# Patient Record
Sex: Male | Born: 1970
Health system: Southern US, Community
[De-identification: ages and names within clinical notes are randomized; demographics above are authoritative.]

## PROBLEM LIST (undated history)

## (undated) DIAGNOSIS — I10 Essential (primary) hypertension: Secondary | ICD-10-CM

## (undated) HISTORY — PX: INNER EAR SURGERY: SHX679

---

## 2019-07-19 DIAGNOSIS — M47819 Spondylosis without myelopathy or radiculopathy, site unspecified: Secondary | ICD-10-CM | POA: Diagnosis not present

## 2019-07-19 DIAGNOSIS — Z1159 Encounter for screening for other viral diseases: Secondary | ICD-10-CM | POA: Diagnosis not present

## 2019-07-19 DIAGNOSIS — Z79899 Other long term (current) drug therapy: Secondary | ICD-10-CM | POA: Diagnosis not present

## 2019-07-19 DIAGNOSIS — M4807 Spinal stenosis, lumbosacral region: Secondary | ICD-10-CM | POA: Diagnosis not present

## 2019-07-19 DIAGNOSIS — M5410 Radiculopathy, site unspecified: Secondary | ICD-10-CM | POA: Diagnosis not present

## 2019-07-19 MED FILL — GABAPENTIN 300 MG CAPSULE: 300 | 90 days supply | Qty: 360 | Fill #0

## 2019-07-19 MED FILL — HYDROCODON-APAP 10-325: 10-325 | 30 days supply | Qty: 180 | Fill #0

## 2019-07-23 MED FILL — PANTOPRAZOLE SOD DR 40 MG T: 40 | 90 days supply | Qty: 90 | Fill #0

## 2019-08-16 DIAGNOSIS — M47819 Spondylosis without myelopathy or radiculopathy, site unspecified: Secondary | ICD-10-CM | POA: Diagnosis not present

## 2019-08-16 DIAGNOSIS — M4807 Spinal stenosis, lumbosacral region: Secondary | ICD-10-CM | POA: Diagnosis not present

## 2019-08-16 DIAGNOSIS — Z79899 Other long term (current) drug therapy: Secondary | ICD-10-CM | POA: Diagnosis not present

## 2019-08-16 DIAGNOSIS — M5410 Radiculopathy, site unspecified: Secondary | ICD-10-CM | POA: Diagnosis not present

## 2019-08-17 MED FILL — HYDROCODON-APAP 10-325: 10-325 | 30 days supply | Qty: 180 | Fill #0

## 2019-08-24 MED FILL — CIPROFLOXACIN-DEXAMETHASONE: 0.3-0.1 | 7 days supply | Qty: 8 | Fill #0

## 2019-09-13 DIAGNOSIS — Z79899 Other long term (current) drug therapy: Secondary | ICD-10-CM | POA: Diagnosis not present

## 2019-09-13 DIAGNOSIS — M4807 Spinal stenosis, lumbosacral region: Secondary | ICD-10-CM | POA: Diagnosis not present

## 2019-09-13 DIAGNOSIS — M47819 Spondylosis without myelopathy or radiculopathy, site unspecified: Secondary | ICD-10-CM | POA: Diagnosis not present

## 2019-09-13 DIAGNOSIS — M5410 Radiculopathy, site unspecified: Secondary | ICD-10-CM | POA: Diagnosis not present

## 2019-09-14 DIAGNOSIS — R030 Elevated blood-pressure reading, without diagnosis of hypertension: Secondary | ICD-10-CM | POA: Diagnosis not present

## 2019-09-14 DIAGNOSIS — K219 Gastro-esophageal reflux disease without esophagitis: Secondary | ICD-10-CM | POA: Diagnosis not present

## 2019-09-14 DIAGNOSIS — E781 Pure hyperglyceridemia: Secondary | ICD-10-CM | POA: Diagnosis not present

## 2019-09-14 DIAGNOSIS — F5104 Psychophysiologic insomnia: Secondary | ICD-10-CM | POA: Diagnosis not present

## 2019-09-14 MED FILL — METOPROLOL SUCCINATE ER 25: 25 | 30 days supply | Qty: 30 | Fill #0

## 2019-09-14 MED FILL — ZOLPIDEM TART ER 12.5 MG TA: 12.5 | 30 days supply | Qty: 30 | Fill #0

## 2019-09-15 MED FILL — HYDROCODON-APAP 10-325: 10-325 | 30 days supply | Qty: 180 | Fill #0

## 2019-10-05 MED FILL — PANTOPRAZOLE SOD DR 40 MG T: 40 | 30 days supply | Qty: 60 | Fill #0

## 2019-10-13 DIAGNOSIS — I10 Essential (primary) hypertension: Secondary | ICD-10-CM | POA: Diagnosis not present

## 2019-10-13 DIAGNOSIS — M4807 Spinal stenosis, lumbosacral region: Secondary | ICD-10-CM | POA: Diagnosis not present

## 2019-10-13 DIAGNOSIS — M47819 Spondylosis without myelopathy or radiculopathy, site unspecified: Secondary | ICD-10-CM | POA: Diagnosis not present

## 2019-10-13 DIAGNOSIS — M5410 Radiculopathy, site unspecified: Secondary | ICD-10-CM | POA: Diagnosis not present

## 2019-10-13 MED FILL — HYDROCODON-APAP 10-325: 10-325 | 30 days supply | Qty: 180 | Fill #0

## 2019-10-26 DIAGNOSIS — K219 Gastro-esophageal reflux disease without esophagitis: Secondary | ICD-10-CM | POA: Diagnosis not present

## 2019-10-26 DIAGNOSIS — I10 Essential (primary) hypertension: Secondary | ICD-10-CM | POA: Diagnosis not present

## 2019-10-26 DIAGNOSIS — E781 Pure hyperglyceridemia: Secondary | ICD-10-CM | POA: Diagnosis not present

## 2019-10-26 DIAGNOSIS — F5104 Psychophysiologic insomnia: Secondary | ICD-10-CM | POA: Diagnosis not present

## 2019-10-26 MED FILL — ZOLPIDEM TART ER 12.5 MG TA: 12.5 | 90 days supply | Qty: 90 | Fill #0

## 2019-10-26 MED FILL — METOPROLOL SUCCINATE ER 25: 25 | 90 days supply | Qty: 90 | Fill #0

## 2019-10-27 MED FILL — EPINEPHRINE 0.3 MG AUTO-INJ: 0.3 | 2 days supply | Qty: 2 | Fill #0

## 2019-11-13 DIAGNOSIS — Z79899 Other long term (current) drug therapy: Secondary | ICD-10-CM | POA: Diagnosis not present

## 2019-11-13 DIAGNOSIS — M47819 Spondylosis without myelopathy or radiculopathy, site unspecified: Secondary | ICD-10-CM | POA: Diagnosis not present

## 2019-11-13 DIAGNOSIS — M4807 Spinal stenosis, lumbosacral region: Secondary | ICD-10-CM | POA: Diagnosis not present

## 2019-11-13 DIAGNOSIS — M5410 Radiculopathy, site unspecified: Secondary | ICD-10-CM | POA: Diagnosis not present

## 2019-11-27 DIAGNOSIS — H60312 Diffuse otitis externa, left ear: Secondary | ICD-10-CM | POA: Diagnosis not present

## 2019-11-27 DIAGNOSIS — H6982 Other specified disorders of Eustachian tube, left ear: Secondary | ICD-10-CM | POA: Diagnosis not present

## 2019-11-27 MED FILL — CIPROFLOXACIN-DEXAMETHASONE: 0.3-0.1 | 19 days supply | Qty: 8 | Fill #0

## 2019-11-27 MED FILL — FLUTICASONE PROP 50 MCG SPR: 50 | 30 days supply | Qty: 16 | Fill #0

## 2019-11-27 MED FILL — predniSONE 20 MG TABS: 20 | 5 days supply | Qty: 10 | Fill #0

## 2019-12-01 MED FILL — PANTOPRAZOLE SOD DR 40 MG T: 40 | 30 days supply | Qty: 60 | Fill #1

## 2019-12-13 DIAGNOSIS — M4807 Spinal stenosis, lumbosacral region: Secondary | ICD-10-CM | POA: Diagnosis not present

## 2019-12-13 DIAGNOSIS — Z79899 Other long term (current) drug therapy: Secondary | ICD-10-CM | POA: Diagnosis not present

## 2019-12-13 DIAGNOSIS — M5410 Radiculopathy, site unspecified: Secondary | ICD-10-CM | POA: Diagnosis not present

## 2019-12-13 DIAGNOSIS — M47819 Spondylosis without myelopathy or radiculopathy, site unspecified: Secondary | ICD-10-CM | POA: Diagnosis not present

## 2020-01-12 DIAGNOSIS — M47819 Spondylosis without myelopathy or radiculopathy, site unspecified: Secondary | ICD-10-CM | POA: Diagnosis not present

## 2020-01-12 DIAGNOSIS — Z79899 Other long term (current) drug therapy: Secondary | ICD-10-CM | POA: Diagnosis not present

## 2020-01-12 DIAGNOSIS — M5410 Radiculopathy, site unspecified: Secondary | ICD-10-CM | POA: Diagnosis not present

## 2020-01-12 DIAGNOSIS — M4807 Spinal stenosis, lumbosacral region: Secondary | ICD-10-CM | POA: Diagnosis not present

## 2020-02-11 DIAGNOSIS — M4807 Spinal stenosis, lumbosacral region: Secondary | ICD-10-CM | POA: Diagnosis not present

## 2020-02-11 DIAGNOSIS — M47819 Spondylosis without myelopathy or radiculopathy, site unspecified: Secondary | ICD-10-CM | POA: Diagnosis not present

## 2020-02-11 DIAGNOSIS — S058X2A Other injuries of left eye and orbit, initial encounter: Secondary | ICD-10-CM | POA: Diagnosis not present

## 2020-02-11 DIAGNOSIS — M5410 Radiculopathy, site unspecified: Secondary | ICD-10-CM | POA: Diagnosis not present

## 2020-02-11 DIAGNOSIS — Z79899 Other long term (current) drug therapy: Secondary | ICD-10-CM | POA: Diagnosis not present

## 2020-02-21 MED FILL — METOPROLOL SUCCINATE ER 25: 25 | 90 days supply | Qty: 90 | Fill #1

## 2020-03-06 ENCOUNTER — Other Ambulatory Visit (HOSPITAL_COMMUNITY): Payer: Self-pay | Admitting: Family Medicine

## 2020-03-06 DIAGNOSIS — K219 Gastro-esophageal reflux disease without esophagitis: Secondary | ICD-10-CM | POA: Diagnosis not present

## 2020-03-06 DIAGNOSIS — Z8639 Personal history of other endocrine, nutritional and metabolic disease: Secondary | ICD-10-CM | POA: Diagnosis not present

## 2020-03-06 DIAGNOSIS — H60501 Unspecified acute noninfective otitis externa, right ear: Secondary | ICD-10-CM | POA: Diagnosis not present

## 2020-03-06 DIAGNOSIS — E6609 Other obesity due to excess calories: Secondary | ICD-10-CM | POA: Diagnosis not present

## 2020-03-06 DIAGNOSIS — Z6831 Body mass index (BMI) 31.0-31.9, adult: Secondary | ICD-10-CM | POA: Diagnosis not present

## 2020-03-06 DIAGNOSIS — F5104 Psychophysiologic insomnia: Secondary | ICD-10-CM | POA: Diagnosis not present

## 2020-03-06 DIAGNOSIS — E781 Pure hyperglyceridemia: Secondary | ICD-10-CM | POA: Diagnosis not present

## 2020-03-06 MED FILL — ESZOPICLONE 2 MG TAB: 2 | 30 days supply | Qty: 30 | Fill #0

## 2020-03-06 MED FILL — NEO/POLYMYXIN/HC EAR SOLN: 3.5-10000-1 | 17 days supply | Qty: 10 | Fill #0

## 2020-03-13 DIAGNOSIS — M47819 Spondylosis without myelopathy or radiculopathy, site unspecified: Secondary | ICD-10-CM | POA: Diagnosis not present

## 2020-03-13 DIAGNOSIS — Z79899 Other long term (current) drug therapy: Secondary | ICD-10-CM | POA: Diagnosis not present

## 2020-03-13 DIAGNOSIS — M4807 Spinal stenosis, lumbosacral region: Secondary | ICD-10-CM | POA: Diagnosis not present

## 2020-03-13 DIAGNOSIS — M5410 Radiculopathy, site unspecified: Secondary | ICD-10-CM | POA: Diagnosis not present

## 2020-04-03 DIAGNOSIS — F5104 Psychophysiologic insomnia: Secondary | ICD-10-CM | POA: Diagnosis not present

## 2020-04-03 DIAGNOSIS — E6609 Other obesity due to excess calories: Secondary | ICD-10-CM | POA: Diagnosis not present

## 2020-04-03 DIAGNOSIS — K219 Gastro-esophageal reflux disease without esophagitis: Secondary | ICD-10-CM | POA: Diagnosis not present

## 2020-04-03 DIAGNOSIS — I10 Essential (primary) hypertension: Secondary | ICD-10-CM | POA: Diagnosis not present

## 2020-04-03 DIAGNOSIS — Z6831 Body mass index (BMI) 31.0-31.9, adult: Secondary | ICD-10-CM | POA: Diagnosis not present

## 2020-04-06 MED FILL — EPINEPHRINE 0.3 MG AUTO-INJ: 0.3 | 2 days supply | Qty: 2 | Fill #1

## 2020-04-11 MED FILL — FLUTICASONE PROP 50 MCG SPR: 50 | 30 days supply | Qty: 16 | Fill #1

## 2020-04-11 MED FILL — PANTOPRAZOLE SOD DR 40 MG T: 40 | 90 days supply | Qty: 180 | Fill #1

## 2020-04-11 MED FILL — ESZOPICLONE 3 MG TABS: 3 | 30 days supply | Qty: 30 | Fill #0

## 2020-04-13 DIAGNOSIS — M4807 Spinal stenosis, lumbosacral region: Secondary | ICD-10-CM | POA: Diagnosis not present

## 2020-04-13 DIAGNOSIS — Z79899 Other long term (current) drug therapy: Secondary | ICD-10-CM | POA: Diagnosis not present

## 2020-04-13 DIAGNOSIS — M47819 Spondylosis without myelopathy or radiculopathy, site unspecified: Secondary | ICD-10-CM | POA: Diagnosis not present

## 2020-04-13 DIAGNOSIS — M5410 Radiculopathy, site unspecified: Secondary | ICD-10-CM | POA: Diagnosis not present

## 2020-04-21 DIAGNOSIS — E6609 Other obesity due to excess calories: Secondary | ICD-10-CM | POA: Diagnosis not present

## 2020-04-21 DIAGNOSIS — Z6831 Body mass index (BMI) 31.0-31.9, adult: Secondary | ICD-10-CM | POA: Diagnosis not present

## 2020-04-27 DIAGNOSIS — U071 COVID-19: Secondary | ICD-10-CM | POA: Diagnosis not present

## 2020-05-03 MED FILL — ONDANSETRON HCL 4 MG TABLET: 4 | 30 days supply | Qty: 30 | Fill #0

## 2020-05-10 DIAGNOSIS — Z79899 Other long term (current) drug therapy: Secondary | ICD-10-CM | POA: Diagnosis not present

## 2020-05-10 DIAGNOSIS — M4807 Spinal stenosis, lumbosacral region: Secondary | ICD-10-CM | POA: Diagnosis not present

## 2020-05-10 DIAGNOSIS — M47819 Spondylosis without myelopathy or radiculopathy, site unspecified: Secondary | ICD-10-CM | POA: Diagnosis not present

## 2020-05-10 DIAGNOSIS — M5410 Radiculopathy, site unspecified: Secondary | ICD-10-CM | POA: Diagnosis not present

## 2020-05-22 MED FILL — ALBUTEROL SULFATE HFA 108 (: 108 (90 BAS | 50 days supply | Qty: 18 | Fill #0

## 2020-05-22 MED FILL — METOPROLOL SUCCINATE ER 25: 25 | 90 days supply | Qty: 90 | Fill #0

## 2020-05-30 DIAGNOSIS — Z6831 Body mass index (BMI) 31.0-31.9, adult: Secondary | ICD-10-CM | POA: Diagnosis not present

## 2020-05-30 DIAGNOSIS — E6609 Other obesity due to excess calories: Secondary | ICD-10-CM | POA: Diagnosis not present

## 2020-05-31 ENCOUNTER — Other Ambulatory Visit (HOSPITAL_COMMUNITY): Payer: Self-pay | Admitting: Family Medicine

## 2020-05-31 MED FILL — ONDANSETRON HCL 4 MG TABLET: 4 | 30 days supply | Qty: 30 | Fill #0

## 2020-06-13 DIAGNOSIS — Z79899 Other long term (current) drug therapy: Secondary | ICD-10-CM | POA: Diagnosis not present

## 2020-06-13 DIAGNOSIS — B9689 Other specified bacterial agents as the cause of diseases classified elsewhere: Secondary | ICD-10-CM | POA: Diagnosis not present

## 2020-06-13 DIAGNOSIS — E669 Obesity, unspecified: Secondary | ICD-10-CM | POA: Diagnosis not present

## 2020-06-13 DIAGNOSIS — L089 Local infection of the skin and subcutaneous tissue, unspecified: Secondary | ICD-10-CM | POA: Diagnosis not present

## 2020-06-13 DIAGNOSIS — M5410 Radiculopathy, site unspecified: Secondary | ICD-10-CM | POA: Diagnosis not present

## 2020-06-13 DIAGNOSIS — M4807 Spinal stenosis, lumbosacral region: Secondary | ICD-10-CM | POA: Diagnosis not present

## 2020-06-13 DIAGNOSIS — M47819 Spondylosis without myelopathy or radiculopathy, site unspecified: Secondary | ICD-10-CM | POA: Diagnosis not present

## 2020-07-11 DIAGNOSIS — M25252 Flail joint, left hip: Secondary | ICD-10-CM | POA: Diagnosis not present

## 2020-07-11 DIAGNOSIS — M5410 Radiculopathy, site unspecified: Secondary | ICD-10-CM | POA: Diagnosis not present

## 2020-07-11 DIAGNOSIS — Z79899 Other long term (current) drug therapy: Secondary | ICD-10-CM | POA: Diagnosis not present

## 2020-07-11 DIAGNOSIS — M4807 Spinal stenosis, lumbosacral region: Secondary | ICD-10-CM | POA: Diagnosis not present

## 2020-07-11 DIAGNOSIS — M47819 Spondylosis without myelopathy or radiculopathy, site unspecified: Secondary | ICD-10-CM | POA: Diagnosis not present

## 2020-07-17 MED FILL — PANTOPRAZOLE SOD DR 40 MG T: 40 | 90 days supply | Qty: 180 | Fill #0

## 2020-07-17 MED FILL — ALBUTEROL SULFATE HFA 108 (: 108 (90 BAS | 50 days supply | Qty: 18 | Fill #1

## 2020-07-31 ENCOUNTER — Other Ambulatory Visit (HOSPITAL_COMMUNITY): Payer: Self-pay | Admitting: Physician Assistant

## 2020-07-31 MED FILL — PREGABALIN 50 MG CAPS: 50 | 90 days supply | Qty: 270 | Fill #0

## 2020-08-02 MED FILL — METOPROLOL SUCCINATE ER 25: 25 | 90 days supply | Qty: 90 | Fill #1

## 2020-08-11 DIAGNOSIS — M47819 Spondylosis without myelopathy or radiculopathy, site unspecified: Secondary | ICD-10-CM | POA: Diagnosis not present

## 2020-08-11 DIAGNOSIS — M5410 Radiculopathy, site unspecified: Secondary | ICD-10-CM | POA: Diagnosis not present

## 2020-08-11 DIAGNOSIS — Z79899 Other long term (current) drug therapy: Secondary | ICD-10-CM | POA: Diagnosis not present

## 2020-08-11 DIAGNOSIS — M4807 Spinal stenosis, lumbosacral region: Secondary | ICD-10-CM | POA: Diagnosis not present

## 2020-08-11 DIAGNOSIS — M25252 Flail joint, left hip: Secondary | ICD-10-CM | POA: Diagnosis not present

## 2020-08-14 MED FILL — ONDANSETRON HCL 4 MG TABLET: 4 | 30 days supply | Qty: 30 | Fill #1

## 2020-08-22 DIAGNOSIS — E6609 Other obesity due to excess calories: Secondary | ICD-10-CM | POA: Diagnosis not present

## 2020-08-22 DIAGNOSIS — Z6831 Body mass index (BMI) 31.0-31.9, adult: Secondary | ICD-10-CM | POA: Diagnosis not present

## 2020-09-11 DIAGNOSIS — Z79899 Other long term (current) drug therapy: Secondary | ICD-10-CM | POA: Diagnosis not present

## 2020-09-11 DIAGNOSIS — M5410 Radiculopathy, site unspecified: Secondary | ICD-10-CM | POA: Diagnosis not present

## 2020-09-11 DIAGNOSIS — Z1159 Encounter for screening for other viral diseases: Secondary | ICD-10-CM | POA: Diagnosis not present

## 2020-09-11 DIAGNOSIS — M4807 Spinal stenosis, lumbosacral region: Secondary | ICD-10-CM | POA: Diagnosis not present

## 2020-09-11 DIAGNOSIS — M47819 Spondylosis without myelopathy or radiculopathy, site unspecified: Secondary | ICD-10-CM | POA: Diagnosis not present

## 2020-09-13 DIAGNOSIS — K047 Periapical abscess without sinus: Secondary | ICD-10-CM | POA: Diagnosis not present

## 2020-10-07 ENCOUNTER — Ambulatory Visit (INDEPENDENT_AMBULATORY_CARE_PROVIDER_SITE_OTHER): Payer: Worker's Compensation

## 2020-10-07 ENCOUNTER — Ambulatory Visit (HOSPITAL_COMMUNITY): Payer: Self-pay

## 2020-10-07 ENCOUNTER — Encounter (HOSPITAL_COMMUNITY): Payer: Self-pay | Admitting: Emergency Medicine

## 2020-10-07 ENCOUNTER — Other Ambulatory Visit: Payer: Self-pay

## 2020-10-07 ENCOUNTER — Ambulatory Visit (HOSPITAL_COMMUNITY)
Admission: EM | Admit: 2020-10-07 | Discharge: 2020-10-07 | Disposition: A | Discharge status: Home / Self Care | Payer: Worker's Compensation | Attending: Emergency Medicine | Admitting: Emergency Medicine

## 2020-10-07 DIAGNOSIS — S43402A Unspecified sprain of left shoulder joint, initial encounter: Secondary | ICD-10-CM

## 2020-10-07 DIAGNOSIS — W19XXXA Unspecified fall, initial encounter: Secondary | ICD-10-CM

## 2020-10-07 DIAGNOSIS — M25512 Pain in left shoulder: Secondary | ICD-10-CM | POA: Diagnosis not present

## 2020-10-07 HISTORY — DX: Essential (primary) hypertension: I10

## 2020-10-07 MED ORDER — IBUPROFEN 600 MG PO TABS: 600.0000 mg | ORAL_TABLET | Freq: Four times a day (QID) | ORAL | 0 refills | Status: AC | PRN

## 2020-10-07 MED ORDER — ACETAMINOPHEN 325 MG PO TABS
975.0000 mg | ORAL_TABLET | Freq: Once | ORAL | Status: AC
  Administered 2020-10-07: 975 mg via ORAL

## 2020-10-07 MED ORDER — TIZANIDINE HCL 4 MG PO TABS: 4.0000 mg | ORAL_TABLET | Freq: Three times a day (TID) | ORAL | 0 refills | Status: AC | PRN

## 2020-10-07 MED ORDER — IBUPROFEN 800 MG PO TABS
ORAL_TABLET | ORAL | Status: AC
  Filled 2020-10-07: qty 1

## 2020-10-07 MED ORDER — IBUPROFEN 800 MG PO TABS
800.0000 mg | ORAL_TABLET | Freq: Once | ORAL | Status: AC
  Administered 2020-10-07: 800 mg via ORAL

## 2020-10-07 MED ORDER — ACETAMINOPHEN 325 MG PO TABS
ORAL_TABLET | ORAL | Status: AC
  Filled 2020-10-07: qty 3

## 2020-10-07 NOTE — ED Provider Notes (Signed)
HPI  SUBJECTIVE:  Alex Cooke is a right-hand 50 y.o. 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   does not remember if he hit his left shoulder against the bar or whether he grabbed the bar.  States that he landed with his feet on the ground.  He states the pain is located along the top and posterior aspect of his left shoulder.  He states it feels like he "pulled it".  He reports numbness and tingling in his fingers that has since resolved.  No bruising, swelling, grip weakness.  He has not tried anything for this.  No alleviating factors.  Symptoms are worse with movement.  Past medical history of hypertension.  No history of diabetes, osteoporosis, chronic kidney disease, history of left shoulder injury.  JEH:UDJSH, Wilhemina Bonito, MD  This occurred at work-will be a Worker's Compensation case.  Past Medical History:  Diagnosis Date  . Hypertension     Past Surgical History:  Procedure Laterality Date  . INNER EAR SURGERY      Family History  Problem Relation Age of Onset  . Diabetes Mother   . Heart failure Father     Social History   Tobacco Use  . Smoking status: Never Smoker  . Smokeless tobacco: Never Used  Substance Use Topics  . Alcohol use: Never  . Drug use: Never    No current facility-administered medications for this encounter.  Current Outpatient Medications:  .  albuterol (VENTOLIN HFA) 108 (90 Base) MCG/ACT inhaler, Inhale into the lungs., Disp: , Rfl:  .  EPINEPHrine 0.3 mg/0.3 mL IJ SOAJ injection, Inject into the muscle., Disp: , Rfl:  .  ibuprofen (ADVIL) 600 MG tablet, Take 1 tablet (600 mg total) by mouth every 6 (six) hours as needed., Disp: 30 tablet, Rfl: 0 .  metoprolol succinate (TOPROL-XL) 25 MG 24 hr tablet, Take by mouth., Disp: , Rfl:  .  tiZANidine (ZANAFLEX) 4 MG tablet, Take 1 tablet (4 mg total) by mouth every 8 (eight) hours as needed for muscle  spasms., Disp: 30 tablet, Rfl: 0 .  pantoprazole (PROTONIX) 20 MG tablet, Take by mouth., Disp: , Rfl:  .  WEGOVY 0.5 MG/0.5ML SOAJ, , Disp: , Rfl:   Allergies  Allergen Reactions  . Latex Rash and Shortness Of Breath  . Onion Anaphylaxis  . Other Shortness Of Breath  . Talwin [Pentazocine]     Other reaction(s): Other (See Comments) Hallucination Hallucination      ROS  As noted in HPI.   Physical Exam  BP 114/84   Pulse 75   Temp 97.7 F (36.5 C)   Resp 18   SpO2 100%   Constitutional: Well developed, well nourished, appears uncomfortable Eyes:  EOMI, conjunctiva normal bilaterally HENT: Normocephalic, atraumatic,mucus membranes moist Respiratory: Normal inspiratory effort Cardiovascular: Normal rate GI: nondistended skin: No rash, skin intact Musculoskeletal: Left shoulder with ROM somwehat limited, Drop test  painful but negative, clavicle NT, A/C joint NT , scapula tender, proximal humerus  tender, trapezius tender, Motor strength decreased at shoulder due to pain, Sensation intact LT over deltoid region, distal NVI with hand on affected side having intact sensation and strength in the distribution of the median, radial, and ulnar nerve. no pain with internal rotation, pain with external rotation, negative tenderness in bicipital groove, positive empty can test, patient unable to perform liftoff test.  Did not perform abduction/external rotation prior to imaging.  RP 2+.  No bruising over the scapula. Neurologic: Alert & oriented x 3, no focal neuro deficits Psychiatric: Speech and behavior appropriate   ED Course   Medications  acetaminophen (TYLENOL) tablet 975 mg (975 mg Oral Given 10/07/20 1618)  ibuprofen (ADVIL) tablet 800 mg (800 mg Oral Given 10/07/20 1618)    Orders Placed This Encounter  Procedures  . DG Shoulder Left    Standing Status:   Standing    Number of Occurrences:   1    Order Specific Question:   Reason for Exam (SYMPTOM  OR DIAGNOSIS  REQUIRED)    Answer:   L post shoulder pain r/o fx, dislocation    No results found for this or any previous visit (from the past 24 hour(s)). DG Shoulder Left  Result Date: 10/07/2020 CLINICAL DATA:  Acute LEFT shoulder pain following fall. Initial encounter. EXAM: LEFT SHOULDER - 2+ VIEW COMPARISON:  None. FINDINGS: There is no evidence of fracture or dislocation. There is no evidence of arthropathy or other focal bone abnormality. Soft tissues are unremarkable. IMPRESSION: Negative. Electronically Signed   By: Harmon Pier M.D.   On: 10/07/2020 16:42    ED Clinical Impression  1. Sprain of left shoulder, unspecified shoulder sprain type, initial encounter      ED Assessment/Plan  Patient appears uncomfortable.  Will give 975 mg Tylenol/800 mg ibuprofen.  Will obtain x-ray of the shoulder with a scapular view as he is primarily tender along the posterior shoulder and scapula.  Reviewed imaging independently.  Normal shoulder.  See radiology report for full details.  X-ray reassuring.  Patient with left shoulder strain/sprain.  Plan to send home with Tylenol/ibuprofen, Zanaflex, sling left arm.  Will write work note for him to be off tomorrow and he will follow-up with occupational health within the next week.  Works Sunday, is off until Friday.  Discussed  imaging, MDM, treatment plan, and plan for follow-up with patient patient agrees with plan.   Meds ordered this encounter  Medications  . acetaminophen (TYLENOL) tablet 975 mg  . ibuprofen (ADVIL) tablet 800 mg  . ibuprofen (ADVIL) 600 MG tablet    Sig: Take 1 tablet (600 mg total) by mouth every 6 (six) hours as needed.    Dispense:  30 tablet    Refill:  0  . tiZANidine (ZANAFLEX) 4 MG tablet    Sig: Take 1 tablet (4 mg total) by mouth every 8 (eight) hours as needed for muscle spasms.    Dispense:  30 tablet    Refill:  0    *This clinic note was created using Scientist, clinical (histocompatibility and immunogenetics). Therefore, there may be occasional  mistakes despite careful proofreading.   ?    Domenick Gong, MD 10/08/20 [phone]

## 2020-10-07 NOTE — ED Triage Notes (Signed)
Pt states that he was getting out of his car and he caught hisself from falling and grab on to the rail and may have hit his left shoulder. Pt states that he has pain radiating from his left shoulder to the back of his neck

## 2020-10-07 NOTE — Discharge Instructions (Addendum)
Your x-ray is normal.  Take 600 mg of ibuprofen combined with 1000 mg of Tylenol together 3-4 times a day as needed for pain.  Zanaflex for muscle spasms.  Ice the shoulder for 15 to 20 minutes at a time.  Wear the sling as needed for comfort.  Follow-up with occupational health in the next few days.

## 2020-10-10 DIAGNOSIS — M47819 Spondylosis without myelopathy or radiculopathy, site unspecified: Secondary | ICD-10-CM | POA: Diagnosis not present

## 2020-10-10 DIAGNOSIS — Z79899 Other long term (current) drug therapy: Secondary | ICD-10-CM | POA: Diagnosis not present

## 2020-10-10 DIAGNOSIS — M5410 Radiculopathy, site unspecified: Secondary | ICD-10-CM | POA: Diagnosis not present

## 2020-10-10 DIAGNOSIS — M75100 Unspecified rotator cuff tear or rupture of unspecified shoulder, not specified as traumatic: Secondary | ICD-10-CM | POA: Diagnosis not present

## 2020-10-10 DIAGNOSIS — M4807 Spinal stenosis, lumbosacral region: Secondary | ICD-10-CM | POA: Diagnosis not present

## 2020-10-13 ENCOUNTER — Other Ambulatory Visit (HOSPITAL_COMMUNITY): Payer: Self-pay | Admitting: Family Medicine

## 2020-10-13 MED FILL — PANTOPRAZOLE SOD DR 40 MG T: 40 | 90 days supply | Qty: 180 | Fill #1

## 2020-10-13 MED FILL — METOPROLOL SUCCINATE ER 25: 25 | 90 days supply | Qty: 90 | Fill #0

## 2020-10-17 DIAGNOSIS — Z6831 Body mass index (BMI) 31.0-31.9, adult: Secondary | ICD-10-CM | POA: Diagnosis not present

## 2020-10-17 DIAGNOSIS — E6609 Other obesity due to excess calories: Secondary | ICD-10-CM | POA: Diagnosis not present

## 2020-10-26 ENCOUNTER — Other Ambulatory Visit (HOSPITAL_COMMUNITY): Payer: Self-pay | Admitting: Orthopedic Surgery

## 2020-10-26 DIAGNOSIS — S4992XA Unspecified injury of left shoulder and upper arm, initial encounter: Secondary | ICD-10-CM

## 2020-11-03 ENCOUNTER — Other Ambulatory Visit: Payer: Self-pay

## 2020-11-03 ENCOUNTER — Ambulatory Visit (HOSPITAL_COMMUNITY)
Admission: RE | Admit: 2020-11-03 | Discharge: 2020-11-03 | Disposition: A | Discharge status: Home / Self Care | Payer: PRIVATE HEALTH INSURANCE | Source: Ambulatory Visit | Attending: Orthopedic Surgery | Admitting: Orthopedic Surgery

## 2020-11-03 DIAGNOSIS — S4992XA Unspecified injury of left shoulder and upper arm, initial encounter: Secondary | ICD-10-CM | POA: Insufficient documentation

## 2020-11-06 ENCOUNTER — Other Ambulatory Visit: Payer: Self-pay

## 2020-11-06 ENCOUNTER — Ambulatory Visit: Payer: PRIVATE HEALTH INSURANCE | Attending: Surgical | Admitting: Physical Therapy

## 2020-11-06 ENCOUNTER — Encounter: Payer: Self-pay | Admitting: Physical Therapy

## 2020-11-06 DIAGNOSIS — M25612 Stiffness of left shoulder, not elsewhere classified: Secondary | ICD-10-CM | POA: Diagnosis present

## 2020-11-06 DIAGNOSIS — M6281 Muscle weakness (generalized): Secondary | ICD-10-CM | POA: Diagnosis present

## 2020-11-06 DIAGNOSIS — G8929 Other chronic pain: Secondary | ICD-10-CM | POA: Diagnosis present

## 2020-11-06 DIAGNOSIS — R293 Abnormal posture: Secondary | ICD-10-CM | POA: Insufficient documentation

## 2020-11-06 DIAGNOSIS — M25512 Pain in left shoulder: Secondary | ICD-10-CM | POA: Diagnosis not present

## 2020-11-06 NOTE — Therapy (Signed)
[facility] [address] [place], [place], [address] Phone: [phone]   Fax:  [phone]  Physical Therapy Evaluation  Patient Details  Name: Alex Cooke MRN: [id number] Date of Birth: 1970-12-07 Referring Provider (PT): Jiles HaroldLaliberte, Danielle, [place]   Encounter Date: 11/06/2020   PT End of Session - 11/06/20 1105    Visit Number 1    Number of Visits 9    Date for PT Re-Evaluation 12/18/20    Authorization Type workers comp - [employer] [phone]  - capture FOTO on next visit.    PT Start Time 1100    PT Stop Time 1145    PT Time Calculation (min) 45 min    Activity Tolerance Patient limited by pain    Behavior [facility] for tasks assessed/performed           Past Medical History:  Diagnosis Date  . Hypertension     Past Surgical History:  Procedure Laterality Date  . INNER EAR SURGERY      There were no vitals filed for this visit.    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 He reports since onset he has continued pain. He reports pain is along the insdie of the shoudler blad into the joint, and reports N/T into the whole hand when that occurs when his hand is resting his arm.    How long can you sit comfortably? 20 min    How long can you stand comfortably? unlimited    How long can you walk comfortably? unlimited    Diagnostic tests MRI 4/1 1. 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  2. No full-thickness rotator cuff tear.  3. Mild glenohumeral and AC joint osteoarthritis.    Patient Stated Goals get rid of the pain, return to normal activity as much as possible.    Currently in Pain? Yes    Pain Score 9      Pain Location Shoulder    Pain Orientation Left;Medial    Pain Descriptors / Indicators Aching    Pain Type Chronic pain    Pain Onset More than a month ago    Pain Frequency Constant    Aggravating Factors  any activity / position    Pain Relieving Factors N/A    Effect of Pain on Daily Activities limiting shoulder AROM,              [facility] PT Assessment - 11/06/20 0001      Assessment   Medical Diagnosis L shoulder pain    Referring Provider (PT) Jiles HaroldLaliberte, Danielle, PA-C    Onset Date/Surgical Date --   10/07/2020   Hand Dominance Right    Next MD Visit 11/22/2020    Prior Therapy yes   for back     Precautions   Precautions None      Restrictions   Weight Bearing Restrictions No      Balance Screen   Has the patient fallen in the past 6 months Yes    How many times? 1    Has the patient had a decrease in activity level because of a fear of falling?  No  Is the patient reluctant to leave their home because of a fear of falling?  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[employer]   Vocation Requirements lifting, carrying, pushing/ pulling      Cognition   Overall Cognitive Status Within Functional Limits for tasks assessed      Posture/Postural Control   Posture/Postural Control Postural limitations    Postural Limitations Rounded Shoulders      ROM / Strength   AROM / PROM / Strength AROM;Strength;PROM      AROM   AROM Assessment Site Shoulder    Right/Left Shoulder Right;Left    Right Shoulder Extension 85 Degrees    Right Shoulder Flexion 150 Degrees     Right Shoulder ABduction 122 Degrees    Right Shoulder Internal Rotation --   T10   Right Shoulder External Rotation --   T2   Left Shoulder Extension 25 Degrees   pulling in along the upper trap   Left Shoulder Flexion 28 Degrees    Left Shoulder ABduction 36 Degrees    Left Shoulder Internal Rotation --   unable to assess   Left Shoulder External Rotation --   unable to asses     PROM   Overall PROM  Due to pain;Unable to assess    PROM Assessment Site Shoulder    Right/Left Shoulder Left      Strength   Overall Strength Unable to assess;Due to pain      Palpation   Palpation comment TTP along the L thoracic paraspinals, infrspaintus/ suprastus with spasm / triggers in the upper trap/ levator scapulae. pain and cavitation onted along long head of bicep tendon                      Objective measurements completed on examination: See above findings.       [facility] Adult PT Treatment/Exercise - 11/06/20 0001      Modalities   Modalities Electrical Stimulation;Moist Heat      Moist Heat Therapy   Number Minutes Moist Heat 10 Minutes    Moist Heat Location Shoulder   10     Electrical Stimulation   Electrical Stimulation Location L shoulder    Electrical Stimulation Action IFC    Electrical Stimulation Parameters L 13 x 10 min 100% scan    Electrical Stimulation Goals Pain                  PT Education - 11/06/20 1150    Education Details evaluaiont findings, POC, goals, HEP with proper form/ rationale.    Person(s) Educated Patient    Methods Explanation;Verbal cues;Handout    Comprehension Verbalized understanding;Verbal cues required            PT Short Term Goals - 11/06/20 1209      PT SHORT TERM GOAL #1   Title pt to be IND with inital HEP    Time 3    Period  Weeks    Status New    Target Date 11/27/20      PT SHORT TERM GOAL #2   Title caputre FOTO and updated LTGs    Time 1    Period Weeks    Status New    Target Date 11/13/20              PT Long Term Goals - 11/06/20 1210      PT LONG TERM GOAL #1   Title increase L shoulder AROM flexion/ abduction to >/= 80 degrees and IR/ER by >/= 20 degrees with max pain fo 6/10 to promote functional ROM    Time 6    Period Weeks    Status New    Target Date 12/18/20      PT LONG TERM GOAL #2   Title pt to reduce L shoulder max pain to </= 6/10 max pain for improvement in conditionn    Time 6    Period Weeks    Status New    Target Date 12/18/20      PT LONG TERM GOAL #3   Title pt to be IND with all HEP given and is able to maintain and progress current LOF    Time 6    Period Weeks    Status New    Target Date 12/18/20                  Plan - 11/06/20 1203    Clinical Impression Statement pt is a 50 y.o M presenting to OPPT with CC of L shoulder pain occuring after catching himself 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 limited assessment secondary to pain rated at 8/10. Gross L shoulder limtiations in both AROM/ PROM limited due to pain and guarding. TTP along the L upper trap/ levator scapulae with multiple trigger points, tenderness along the proximal biceps brachii, and peri-scapular musculature. he would benefit from physical therapy to decrease L shoulder pain, improve ROM and strength and return to PLOF by addressing the deficits listed.    Personal Factors and Comorbidities Comorbidity 1;Time since onset of injury/illness/exacerbation    Comorbidities hx of hypertension    Stability/Clinical Decision Making Evolving/Moderate complexity    Clinical Decision Making Moderate    Rehab Potential Good    PT Frequency 2x / week    PT Duration 4 weeks    PT Treatment/Interventions ADLs/Self Care Home Management;Electrical Stimulation;Iontophoresis 4mg /ml Dexamethasone;Cryotherapy;Moist Heat;Ultrasound;Gait training;Therapeutic activities;Therapeutic exercise;Balance training;Neuromuscular re-education;Manual techniques;Passive  range of motion;Dry needling;Taping;Vasopneumatic Device;Patient/family education    PT Next Visit Plan review / update HEP PRN, CAPTURE FOTO, shoulder PROM, AAROM, gentle scapular stability, STW, modalities for pain.    PT Home Exercise Plan [id number] - upper trap stretch, table slides flexion/ abduction, ER    Consulted and Agree with Plan of Care Patient           Patient will benefit from skilled therapeutic intervention in order to improve the following deficits and impairments:  Improper body mechanics,Decreased knowledge of precautions,Pain,Decreased range of motion,Decreased strength,Increased muscle spasms,Decreased endurance,Decreased activity tolerance  Visit Diagnosis: Chronic left shoulder pain  Muscle weakness (generalized)  Stiffness of left shoulder, not elsewhere classified  Abnormal posture     Problem List There are no problems to display for this patient.  PT, DPT, LAT, ATC  11/06/20  12:16 PM      [facility] [address] [place], [place], [place]  Phone: [phone]   Fax:  [phone]  Name: Alex Cooke MRN: [id number] Date of Birth: 08-19-1970

## 2020-11-09 ENCOUNTER — Ambulatory Visit: Payer: PRIVATE HEALTH INSURANCE | Attending: Surgical

## 2020-11-09 ENCOUNTER — Other Ambulatory Visit: Payer: Self-pay

## 2020-11-09 DIAGNOSIS — M6281 Muscle weakness (generalized): Secondary | ICD-10-CM

## 2020-11-09 DIAGNOSIS — G8929 Other chronic pain: Secondary | ICD-10-CM

## 2020-11-09 DIAGNOSIS — M25612 Stiffness of left shoulder, not elsewhere classified: Secondary | ICD-10-CM | POA: Diagnosis present

## 2020-11-09 DIAGNOSIS — R293 Abnormal posture: Secondary | ICD-10-CM | POA: Diagnosis present

## 2020-11-09 DIAGNOSIS — M25512 Pain in left shoulder: Secondary | ICD-10-CM | POA: Diagnosis not present

## 2020-11-09 NOTE — Therapy (Signed)
[facility] [address] [place], [place], [address] Phone: [phone]   Fax:  [phone]  Physical Therapy Treatment  Patient Details  Name: Alex Cooke MRN: [id number] Date of Birth: 09/24/1970 Referring Provider (PT): Jiles Harold, [place]   Encounter Date: 11/09/2020   PT End of Session - 11/09/20 1242    Visit Number 2    Number of Visits 9    Date for PT Re-Evaluation 12/18/20    Authorization Type workers comp - [employer] [phone]  -    Authorization - Visit Number 1    Authorization - Number of Visits 8    PT Start Time 1231    PT Stop Time 1321   ice pack at end of session 10 minutes   PT Time Calculation (min) 50 min    Activity Tolerance Patient limited by pain    Behavior During Therapy [facility] for tasks assessed/performed           Past Medical History:  Diagnosis Date  . Hypertension     Past Surgical History:  Procedure Laterality Date  . INNER EAR SURGERY      There were no vitals filed for this visit.   Subjective Assessment - 11/09/20 1233    Subjective Patient reports he hates his shoulder right now. He has been completing his HEP, but doesn't feel it is helping. He reports that he isn't very optimistic about getting back to his previous level of function and work duties.    How long can you sit comfortably? 20 min    How long can you stand comfortably? unlimited    How long can you walk comfortably? unlimited    Diagnostic tests MRI 4/1 1. 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  2. No full-thickness rotator cuff tear.  3. Mild glenohumeral and AC joint osteoarthritis.    Patient Stated Goals get rid of the pain, return to normal activity as much as possible.    Currently in Pain? Yes    Pain Score 8     Pain Location --   shoulder and scapula   Pain Orientation Left;Anterior;Posterior    Pain Descriptors / Indicators Aching;Burning    Pain Type --     Pain Onset More than a month ago              [facility] PT Assessment - 11/09/20 0001      Observation/Other Assessments   Focus on Therapeutic Outcomes (FOTO)  30% function 58% predicted                         [facility] Adult PT Treatment/Exercise - 11/09/20 0001      Self-Care   Self-Care Other Self-Care Comments    Other Self-Care Comments  see patient education      Neck Exercises: Theraband   Scapula Retraction Limitations attempted pain      Neck Exercises: Seated   Neck Retraction Limitations x 10; retraction with overpressure x 10; retraction with extension x 10    Shoulder Rolls 10 reps      Shoulder Exercises: Seated   Flexion 5 reps    Flexion Limitations towel slides    Other Seated Exercises scapular retraction attempted      Shoulder Exercises: ROM/Strengthening   Pendulum attempted pain      Cryotherapy   Number Minutes Cryotherapy 10 Minutes    Cryotherapy Location Shoulder    Type of Cryotherapy Ice  pack      Manual Therapy   Manual therapy comments STM posterior shoulder musculature Lt                  PT Education - 11/09/20 1326    Education Details FOTO score and anticipated progress. Posture education. Anatomy of current condition and rationale for exercises.    Person(s) Educated Patient    Methods Explanation;Demonstration;Verbal cues;Tactile cues    Comprehension Verbalized understanding;Verbal cues required;Need further instruction;Tactile cues required            PT Short Term Goals - 11/06/20 1209      PT SHORT TERM GOAL #1   Title pt to be IND with inital HEP    Time 3    Period Weeks    Status New    Target Date 11/27/20      PT SHORT TERM GOAL #2   Title caputre FOTO and updated LTGs    Time 1    Period Weeks    Status New    Target Date 11/13/20             PT Long Term Goals - 11/06/20 1210      PT LONG TERM GOAL #1   Title increase L shoulder AROM flexion/ abduction to >/= 80 degrees and  IR/ER by >/= 20 degrees with max pain fo 6/10 to promote functional ROM    Time 6    Period Weeks    Status New    Target Date 12/18/20      PT LONG TERM GOAL #2   Title pt to reduce L shoulder max pain to </= 6/10 max pain for improvement in conditionn    Time 6    Period Weeks    Status New    Target Date 12/18/20      PT LONG TERM GOAL #3   Title pt to be IND with all HEP given and is able to maintain and progress current LOF    Time 6    Period Weeks    Status New    Target Date 12/18/20                 Plan - 11/09/20 1242    Clinical Impression Statement Session was limited due to patient's pain and guarding of the Lt shoulder with attempts of various light shoulder mobility and postural correctives. Trialed repeated cervical retraction/extension movements as patient reports burning sensation along Lt medial scapula border, though no effect on pain with repeated movement. Attemped pendulums and scapular retraction with patient reporting increased pain with these exercises along the shoulder and scapular region, so these exercises we discontinued. Educated patient on importance of improving mobility to assist in overall pain reduction, though his tolerance to gentle movement at this time is severely limited due to pain and he remains guarded in the Lt shoulder throughout today's session.    Personal Factors and Comorbidities Comorbidity 1;Time since onset of injury/illness/exacerbation    Comorbidities hx of hypertension    Stability/Clinical Decision Making Evolving/Moderate complexity    Rehab Potential Good    PT Frequency 2x / week    PT Duration 4 weeks    PT Treatment/Interventions ADLs/Self Care Home Management;Electrical Stimulation;Iontophoresis 4mg /ml Dexamethasone;Cryotherapy;Moist Heat;Ultrasound;Gait training;Therapeutic activities;Therapeutic exercise;Balance training;Neuromuscular re-education;Manual techniques;Passive range of motion;Dry  needling;Taping;Vasopneumatic Device;Patient/family education    PT Next Visit Plan review / update HEP PRN,  shoulder PROM, AAROM, gentle scapular stability, STW, modalities for pain.    PT  Home Exercise Plan [id number] - upper trap stretch, table slides flexion/ abduction, ER    Consulted and Agree with Plan of Care Patient           Patient will benefit from skilled therapeutic intervention in order to improve the following deficits and impairments:  Improper body mechanics,Decreased knowledge of precautions,Pain,Decreased range of motion,Decreased strength,Increased muscle spasms,Decreased endurance,Decreased activity tolerance  Visit Diagnosis: Chronic left shoulder pain  Muscle weakness (generalized)  Stiffness of left shoulder, not elsewhere classified  Abnormal posture     Problem List There are no problems to display for this patient.  Letitia Libra, PT, DPT, ATC 11/09/20 1:34 PM  [facility] [address] [place], [place], [address] Phone: [phone]   Fax:  [phone]  Name: Alex Cooke MRN: [id number] Date of Birth: 1970/10/21

## 2020-11-10 DIAGNOSIS — M47819 Spondylosis without myelopathy or radiculopathy, site unspecified: Secondary | ICD-10-CM | POA: Diagnosis not present

## 2020-11-10 DIAGNOSIS — M25512 Pain in left shoulder: Secondary | ICD-10-CM | POA: Diagnosis not present

## 2020-11-10 DIAGNOSIS — M4807 Spinal stenosis, lumbosacral region: Secondary | ICD-10-CM | POA: Diagnosis not present

## 2020-11-10 DIAGNOSIS — M5410 Radiculopathy, site unspecified: Secondary | ICD-10-CM | POA: Diagnosis not present

## 2020-11-10 DIAGNOSIS — Z79899 Other long term (current) drug therapy: Secondary | ICD-10-CM | POA: Diagnosis not present

## 2020-11-14 ENCOUNTER — Ambulatory Visit: Payer: PRIVATE HEALTH INSURANCE | Attending: Surgical | Admitting: Physical Therapy

## 2020-11-14 ENCOUNTER — Encounter: Payer: Self-pay | Admitting: Physical Therapy

## 2020-11-14 ENCOUNTER — Other Ambulatory Visit: Payer: Self-pay

## 2020-11-14 DIAGNOSIS — G8929 Other chronic pain: Secondary | ICD-10-CM | POA: Insufficient documentation

## 2020-11-14 DIAGNOSIS — M25612 Stiffness of left shoulder, not elsewhere classified: Secondary | ICD-10-CM | POA: Insufficient documentation

## 2020-11-14 DIAGNOSIS — M6281 Muscle weakness (generalized): Secondary | ICD-10-CM | POA: Diagnosis present

## 2020-11-14 DIAGNOSIS — M25512 Pain in left shoulder: Secondary | ICD-10-CM | POA: Insufficient documentation

## 2020-11-14 DIAGNOSIS — R293 Abnormal posture: Secondary | ICD-10-CM | POA: Insufficient documentation

## 2020-11-14 NOTE — Therapy (Signed)
[place], [place], [address] Phone: [phone]   Fax:  [phone]  Physical Therapy Treatment  Patient Details  Name: JAYMARI CROMIE MRN: [id number] Date of Birth: 02-17-1971 Referring Provider (PT): Grier Mitts, [place]   Encounter Date: 11/14/2020   PT End of Session - 11/14/20 1418    Visit Number 3    Number of Visits 9    Date for PT Re-Evaluation 12/18/20    Authorization Type workers comp - [employer] [phone]  -    Authorization - Visit Number 2    Authorization - Number of Visits 8    PT Start Time 2957    PT Stop Time 1400    PT Time Calculation (min) 45 min           Past Medical History:  Diagnosis Date  . Hypertension     Past Surgical History:  Procedure Laterality Date  . INNER EAR SURGERY      There were no vitals filed for this visit.   Subjective Assessment - 11/14/20 1322    Subjective Shoulder hurts 7/10. Now my pain is more on my lateral upper arm and my bicep hurts, this is new. Back is burning on the left side which started when I began sitting for light duty.    Currently in Pain? Yes    Pain Score 7     Pain Location Shoulder    Pain Orientation Left;Anterior;Lateral    Pain Descriptors / Indicators Sharp;Burning;Aching    Pain Type Chronic pain    Aggravating Factors  any movements    Pain Relieving Factors avoiding painful movements              [facility] PT Assessment - 11/14/20 0001      AROM   Left Shoulder Flexion 115 Degrees    Left Shoulder ABduction 90 Degrees    Left Shoulder Internal Rotation --   reaches to left hip   Left Shoulder External Rotation --   reaches ear with assist from opp UE     PROM   Left Shoulder Flexion 120 Degrees                         [facility] Adult PT Treatment/Exercise - 11/14/20 0001      Neck Exercises: Seated   Postural Training standing scap retract x 10      Shoulder Exercises:  Seated   Flexion 5 reps    Flexion Limitations table slides seated at igh mat table.      Cryotherapy   Number Minutes Cryotherapy 5 Minutes    Cryotherapy Location Shoulder    Type of Cryotherapy Ice pack      Manual Therapy   Manual Therapy Passive ROM    Manual therapy comments STW lateral upper arm and bicep    Passive ROM Passive ROM shoulder flexion to 120                    PT Short Term Goals - 11/06/20 1209      PT SHORT TERM GOAL #1   Title pt to be IND with inital HEP    Time 3    Period Weeks    Status New    Target Date 11/27/20      PT SHORT TERM GOAL #2   Title caputre FOTO and updated LTGs    Time 1    Period Weeks  Status New    Target Date 11/13/20             PT Long Term Goals - 11/06/20 1210      PT LONG TERM GOAL #1   Title increase L shoulder AROM flexion/ abduction to >/= 80 degrees and IR/ER by >/= 20 degrees with max pain fo 6/10 to promote functional ROM    Time 6    Period Weeks    Status New    Target Date 12/18/20      PT LONG TERM GOAL #2   Title pt to reduce L shoulder max pain to </= 6/10 max pain for improvement in conditionn    Time 6    Period Weeks    Status New    Target Date 12/18/20      PT LONG TERM GOAL #3   Title pt to be IND with all HEP given and is able to maintain and progress current LOF    Time 6    Period Weeks    Status New    Target Date 12/18/20                 Plan - 11/14/20 1421    Clinical Impression Statement Pt reports 7/10 shoulder pain which is now more anterior shoulder/bicep and lateral upper arm. Upon palpation his upper arm has multiple spasm areas which were softened with STW. He reports new onset of left lower back pain which started while sitting for light duty. His standing AROM has improved to 115 flexion and 90 degrees abduction. He contines to be very limited with active reach into IR or ER. Reviewed his flexion table slides which he reported increased pain. He was  able to perform scap squeezes with increased pain. Time spent with manaul STW to decrease spasm in upper arm and stretch bicep. Ice applied at end of session to decrease pain.    PT Next Visit Plan Pt has high level of pain and muscle guarding; consider HOLD or contact WC ME:BRAXENMM; review / update HEP PRN,  shoulder PROM, AAROM, gentle scapular stability, STW, modalities for pain.    PT Home Exercise Plan [id number] - upper trap stretch, table slides flexion/ abduction, ER           Patient will benefit from skilled therapeutic intervention in order to improve the following deficits and impairments:  Improper body mechanics,Decreased knowledge of precautions,Pain,Decreased range of motion,Decreased strength,Increased muscle spasms,Decreased endurance,Decreased activity tolerance  Visit Diagnosis: Chronic left shoulder pain  Muscle weakness (generalized)  Stiffness of left shoulder, not elsewhere classified  Abnormal posture     Problem List There are no problems to display for this patient.   Dorene Ar, [place] 11/14/2020, 2:26 PM  [place], [place], [address] Phone: [phone]   Fax:  [phone]  Name: ESTHER BROYLES MRN: [id number] Date of Birth: 1971/04/15

## 2020-11-16 ENCOUNTER — Encounter: Payer: Self-pay | Admitting: Physical Therapy

## 2020-11-16 ENCOUNTER — Other Ambulatory Visit: Payer: Self-pay

## 2020-11-16 ENCOUNTER — Ambulatory Visit: Payer: PRIVATE HEALTH INSURANCE | Admitting: Physical Therapy

## 2020-11-16 DIAGNOSIS — M6281 Muscle weakness (generalized): Secondary | ICD-10-CM

## 2020-11-16 DIAGNOSIS — R293 Abnormal posture: Secondary | ICD-10-CM

## 2020-11-16 DIAGNOSIS — M25612 Stiffness of left shoulder, not elsewhere classified: Secondary | ICD-10-CM

## 2020-11-16 DIAGNOSIS — M25512 Pain in left shoulder: Secondary | ICD-10-CM | POA: Diagnosis not present

## 2020-11-16 DIAGNOSIS — G8929 Other chronic pain: Secondary | ICD-10-CM

## 2020-11-16 NOTE — Therapy (Signed)
[facility] [address] [place], [place], [address] Phone: [phone]   Fax:  [phone]  Physical Therapy Treatment  Patient Details  Name: Alex Cooke MRN: [id number] Date of Birth: [birth year] Referring Provider (PT): Jiles Harold, [place]   Encounter Date: 11/16/2020   PT End of Session - 11/16/20 1610    Visit Number 4    Number of Visits 9    Date for PT Re-Evaluation 12/18/20    Authorization Type workers comp - [employer] [id number]  -    Authorization - Visit Number 3    Authorization - Number of Visits 8    PT Start Time 1500    PT Stop Time 1550    PT Time Calculation (min) 50 min    Activity Tolerance Patient limited by pain    Behavior During Therapy [facility] for tasks assessed/performed           Past Medical History:  Diagnosis Date  . Hypertension     Past Surgical History:  Procedure Laterality Date  . INNER EAR SURGERY      There were no vitals filed for this visit.   Subjective Assessment - 11/16/20 1541    Subjective Pt. continues with high level of left shoulder pain today noting discomfort in left axillar region, bicep and proximal arm. He reports no significant improvement with therapy to date. Pain 7/10 this PM. Next MD follow up is 11/22/20.                             [facility] Adult PT Treatment/Exercise - 11/16/20 0001      Elbow Exercises   Elbow Flexion --   bicep curl/elbow flexion AROM x 15 reps     Shoulder Exercises: Supine   Other Supine Exercises supine wand ER AAROM x 15 reps, supine wand "chest press" AAROM assisted by therapist x 10 reps      Shoulder Exercises: Seated   Flexion AAROM;Left;10 reps    Flexion Limitations tables slides      Shoulder Exercises: Sidelying   External Rotation AROM;Left;15 reps    External Rotation Limitations partial ROM/limited due to c/o pain with "popping"      Shoulder Exercises: Standing   Other Standing  Exercises left shoulder pendulums x 2 minutes, attempted "saw" scapular retraction but held due to pain, switched to standing bilat. scapular retraction and performed x 15 reps      Cryotherapy   Number Minutes Cryotherapy 10 Minutes    Cryotherapy Location Shoulder    Type of Cryotherapy Ice pack      Manual Therapy   Manual Therapy Soft tissue mobilization;Joint mobilization    Joint Mobilization Grade I-II left GH AP mobilization    Soft tissue mobilization STM left bicep, deltoid region and pec major    Passive ROM PROM left shoulder all directions                  PT Education - 11/16/20 1609    Education Details POC, exercises    Person(s) Educated Patient    Methods Explanation;Verbal cues;Demonstration    Comprehension Verbalized understanding;Returned demonstration            PT Short Term Goals - 11/06/20 1209      PT SHORT TERM GOAL #1   Title pt to be IND with inital HEP    Time 3    Period Weeks    Status  New    Target Date 11/27/20      PT SHORT TERM GOAL #2   Title caputre FOTO and updated LTGs    Time 1    Period Weeks    Status New    Target Date 11/13/20             PT Long Term Goals - 11/06/20 1210      PT LONG TERM GOAL #1   Title increase L shoulder AROM flexion/ abduction to >/= 80 degrees and IR/ER by >/= 20 degrees with max pain fo 6/10 to promote functional ROM    Time 6    Period Weeks    Status New    Target Date 12/18/20      PT LONG TERM GOAL #2   Title pt to reduce L shoulder max pain to </= 6/10 max pain for improvement in conditionn    Time 6    Period Weeks    Status New    Target Date 12/18/20      PT LONG TERM GOAL #3   Title pt to be IND with all HEP given and is able to maintain and progress current LOF    Time 6    Period Weeks    Status New    Target Date 12/18/20                 Plan - 11/16/20 1653    Clinical Impression Statement No improvement with therapy to date with continued high  pain level and limited tolerance for exercises. Pt. has MD follow up next week prior to next scheduled PT session so will await status from MD visit if any changes in POC vs. continue to progress as tolerated on return. Minimal stiffness with PROM with ROM limitations more associated with weakness and pain.    Personal Factors and Comorbidities Comorbidity 1;Time since onset of injury/illness/exacerbation    Comorbidities hx of hypertension    Stability/Clinical Decision Making Evolving/Moderate complexity    Clinical Decision Making Moderate    Rehab Potential Good    PT Frequency 2x / week    PT Duration 4 weeks    PT Treatment/Interventions ADLs/Self Care Home Management;Electrical Stimulation;Iontophoresis 4mg /ml Dexamethasone;Cryotherapy;Moist Heat;Ultrasound;Gait training;Therapeutic activities;Therapeutic exercise;Balance training;Neuromuscular re-education;Manual techniques;Passive range of motion;Dry needling;Taping;Vasopneumatic Device;Patient/family education    PT Next Visit Plan Await status from MD follow up 4/20, if returning continue to progress shoulder AAROM, AROM as tolerated pending tolerance, manual work and modalities as needed for pain    PT Home Exercise Plan [id number] - upper trap stretch, table slides flexion/ abduction, ER    Consulted and Agree with Plan of Care Patient           Patient will benefit from skilled therapeutic intervention in order to improve the following deficits and impairments:  Improper body mechanics,Decreased knowledge of precautions,Pain,Decreased range of motion,Decreased strength,Increased muscle spasms,Decreased endurance,Decreased activity tolerance  Visit Diagnosis: Chronic left shoulder pain  Muscle weakness (generalized)  Stiffness of left shoulder, not elsewhere classified  Abnormal posture     Problem List There are no problems to display for this patient.   5/20, PT, DPT 11/16/20 4:58 PM  [facility] [address] [place], [place], [place] Phone: [phone]   Fax:  [phone]  Name: Alex Cooke MRN: Peterson Ao Date of Birth: January 11, 1971

## 2020-11-23 ENCOUNTER — Ambulatory Visit: Payer: PRIVATE HEALTH INSURANCE | Admitting: Rehabilitative and Restorative Service Providers"

## 2020-11-23 ENCOUNTER — Ambulatory Visit (HOSPITAL_COMMUNITY)
Admission: RE | Admit: 2020-11-23 | Discharge: 2020-11-23 | Disposition: A | Discharge status: Home / Self Care | Payer: PRIVATE HEALTH INSURANCE | Source: Ambulatory Visit | Attending: Orthopedic Surgery | Admitting: Orthopedic Surgery

## 2020-11-23 ENCOUNTER — Other Ambulatory Visit: Payer: Self-pay

## 2020-11-23 ENCOUNTER — Encounter: Payer: Self-pay | Admitting: Rehabilitative and Restorative Service Providers"

## 2020-11-23 ENCOUNTER — Other Ambulatory Visit (HOSPITAL_COMMUNITY): Payer: Self-pay | Admitting: Orthopedic Surgery

## 2020-11-23 DIAGNOSIS — M25512 Pain in left shoulder: Secondary | ICD-10-CM

## 2020-11-23 DIAGNOSIS — M6281 Muscle weakness (generalized): Secondary | ICD-10-CM

## 2020-11-23 DIAGNOSIS — M25612 Stiffness of left shoulder, not elsewhere classified: Secondary | ICD-10-CM

## 2020-11-23 DIAGNOSIS — M79602 Pain in left arm: Secondary | ICD-10-CM | POA: Insufficient documentation

## 2020-11-23 DIAGNOSIS — R293 Abnormal posture: Secondary | ICD-10-CM

## 2020-11-23 DIAGNOSIS — M79601 Pain in right arm: Secondary | ICD-10-CM | POA: Diagnosis present

## 2020-11-23 DIAGNOSIS — G8929 Other chronic pain: Secondary | ICD-10-CM

## 2020-11-23 NOTE — Therapy (Addendum)
[place], [place], [address] Phone: [phone]   Fax:  [phone]  Physical Therapy Treatment  Patient Details  Name: Alex Cooke MRN: [id number] Date of Birth: 10/03/1970 Referring Provider (PT): Grier Mitts, [place]   Encounter Date: 11/23/2020   PT End of Session - 11/23/20 1738    Visit Number 5    Number of Visits 9    Date for PT Re-Evaluation 12/18/20    Authorization Type workers comp - [employer] [phone]  -    Authorization - Visit Number 5    Authorization - Number of Visits 8    PT Start Time 0204    PT Stop Time 0250    PT Time Calculation (min) 46 min    Activity Tolerance Patient limited by pain    Behavior During Therapy Agitated;Restless;Anxious           Past Medical History:  Diagnosis Date  . Hypertension     Past Surgical History:  Procedure Laterality Date  . INNER EAR SURGERY      There were no vitals filed for this visit.   Subjective Assessment - 11/23/20 1406    Subjective "MD stated that my issue was a chronic problem and MD disagreed with radiologist. MD gave me a cortisone shot and told me to come back to PT. I am not getting better. I told the doctor that. I told him exactly what I thought. I didn't sleep at all last night I was hurting so badly."    Currently in Pain? Yes    Pain Score 7     Pain Location Shoulder    Pain Orientation Left;Anterior;Posterior;Proximal    Pain Type Chronic pain    Pain Onset More than a month ago    Pain Frequency Constant    Multiple Pain Sites No                                       PT Short Term Goals - 11/06/20 1209      PT SHORT TERM GOAL #1   Title pt to be IND with inital HEP    Time 3    Period Weeks    Status New    Target Date 11/27/20      PT SHORT TERM GOAL #2   Title caputre FOTO and updated LTGs    Time 1    Period Weeks    Status New    Target Date  11/13/20             PT Long Term Goals - 11/23/20 1749      PT LONG TERM GOAL #1   Title increase L shoulder AROM flexion/ abduction to >/= 80 degrees and IR/ER by >/= 20 degrees with max pain fo 6/10 to promote functional ROM    Status On-going      PT LONG TERM GOAL #2   Title pt to reduce L shoulder max pain to </= 6/10 max pain for improvement in conditionn    Status On-going      PT LONG TERM GOAL #3   Title pt to be IND with all HEP given and is able to maintain and progress current LOF    Status On-going                 Plan - 11/23/20 1741  Clinical Impression Statement Pt arrived to PT upset regarding MD appt 11/22/20. He received a cortisone injection 11/22/20. Pt was displeased with his care by Dr. Tamera Punt. He voiced increased resting night pain and inability to sleep all night due to L shoulder pain. Upon assessment, PT noted that L deltoid was warmer compared to the contralateral side along with L hand discoloration. L hand was dark blue and cold compared to contralateral side. Radial pulse present L. L finger capillary refill present throughout digits 1-5. PT phoned [facility] office and spoke with a clinician (name not remembered) who stated he would get message to [place] due to [place] being in surgery. PT phoned wc manager Almyra Free and [facility] case manager regarding pt presentation of L hand's blue discoloration throughout and his reports of increased L shoulder pain after injection. Circumferential measurement taken wrist to mid palm in figure 8 fashion L 49 cm and 48 cm R. Palmar/forearm circumferential measurements =. MD/case managers to phone patient regarding next steps given change in status. Pt voices he did not notice his hand was blue and cold but only that his L shoulder hurt more. PT only charging for assessment today. Pictures were taken by evaluating PT and sent to [facility] for assessment. Evaluating PT spoke with Andee Poles, [place], regarding pt presentation as  above.    Rehab Potential Good    PT Frequency 2x / week    PT Duration 4 weeks    PT Treatment/Interventions ADLs/Self Care Home Management;Electrical Stimulation;Iontophoresis 75m/ml Dexamethasone;Cryotherapy;Moist Heat;Ultrasound;Gait training;Therapeutic activities;Therapeutic exercise;Balance training;Neuromuscular re-education;Manual techniques;Passive range of motion;Dry needling;Taping;Vasopneumatic Device;Patient/family education    PT Next Visit Plan What did MD say? Per prior plan, continue to progress AAROM, AROM as tol, manual work and modalities PRN    Consulted and Agree with Plan of Care Patient           Patient will benefit from skilled therapeutic intervention in order to improve the following deficits and impairments:  Improper body mechanics,Decreased knowledge of precautions,Pain,Decreased range of motion,Decreased strength,Increased muscle spasms,Decreased endurance,Decreased activity tolerance  Visit Diagnosis: Chronic left shoulder pain  Muscle weakness (generalized)  Stiffness of left shoulder, not elsewhere classified  Abnormal posture     Problem List There are no problems to display for this patient.   NAmerica Cooke PT, DPT 11/23/2020, 5:58 PM  [facility] [place] [address]Phone: [phone]  Fax:  [phone] Name: Alex Cooke: [id number] of Birth: [birth year]  PHYSICAL THERAPY DISCHARGE SUMMARY  Visits from Start of Care: 5  Current functional level related to goals / functional outcomes: See notes; PT had notification to discharge patient due to [facility] to [employer]   Remaining deficits: pain   Education / Equipment: HEP; see notes Plan: Patient agrees to discharge.  Patient goals were not met. Patient is being discharged due to the physician's request.  ?????

## 2020-11-28 ENCOUNTER — Other Ambulatory Visit: Payer: Self-pay

## 2020-11-28 ENCOUNTER — Ambulatory Visit (INDEPENDENT_AMBULATORY_CARE_PROVIDER_SITE_OTHER): Payer: PRIVATE HEALTH INSURANCE | Admitting: Physical Medicine and Rehabilitation

## 2020-11-28 ENCOUNTER — Encounter: Payer: Self-pay | Admitting: Physical Medicine and Rehabilitation

## 2020-11-28 ENCOUNTER — Ambulatory Visit: Payer: PRIVATE HEALTH INSURANCE

## 2020-11-28 DIAGNOSIS — R202 Paresthesia of skin: Secondary | ICD-10-CM

## 2020-11-28 NOTE — Progress Notes (Signed)
Pt state left shoulder and arm pain. Pt state when moving arm outward or lifting object makes the pain worse. Pt state he doesn't take anything for the pain. Pt state he is right handed.  Numeric Pain Rating Scale and Functional Assessment Average Pain 5   In the last MONTH (on 0-10 scale) has pain interfered with the following?  1. General activity like being  able to carry out your everyday physical activities such as walking, climbing stairs, carrying groceries, or moving a chair?  Rating(8)

## 2020-11-29 NOTE — Progress Notes (Signed)
Alex Cooke - 50 y.o. male MRN [id number]  Date of birth: [birth year]  Office Visit Note: Visit Date: 11/28/2020 PCP: Clyda Hurdle, MD Referred by: Clyda Hurdle, MD  Subjective: Chief Complaint  Patient presents with  . Right Shoulder - Pain  . Right Arm - Pain   HPI:  Alex Cooke is a 50 y.o. male who comes in today At the request of Dr. Isabella Stalling at [place] for electrodiagnostic study of the left upper limb.  Patient is right-hand dominant.  Patient reports 5 out of 10 pain in the left shoulder and arm radiating into the dorsal lateral part of the forearm since accident about 6 to 7 weeks ago.  The date of the injury was October 07, 2020 where evidently he was stepping out of a truck and fell grabbing the door and rail with his left arm.  He has tried and failed conservative care with medication management, physical therapy and injection treatment.  He did have MRI of the shoulder which Dr. Tamera Punt reviewed is having mild arthritic changes of the glenohumeral joint and AC joint as well as some low-grade interstitial changes of the distal subscapularis tendon.  He has not had MRI of the cervical spine.  He has had no prior history of cervical radiculopathy.  No prior history of carpal tunnel syndrome.  He has not had any electrodiagnostic studies in the past.  He is not diabetic.  He does report that his physical therapist noticed swelling on the left arm and discoloration and swelling and temperature change.  He had not noticed until they mentioned it but he does notice now increased sweating as well as coolness and cooler temperature of the left hand in a nondermatomal fashion.  Initially he had some symptoms into the more radial digits but the symptoms into the fingers is actually improved.  ROS Otherwise per HPI.  Assessment & Plan: Visit Diagnoses:    ICD-10-CM   1. Paresthesia of skin  R20.2 NCV with EMG (electromyography)     Plan: Impression: The above electrodiagnostic study is ABNORMAL and reveals evidence of a mild left median nerve entrapment at the wrist affecting sensory components.  There is likely some temperature artifact.    . There is no significant electrodiagnostic evidence of any other focal nerve entrapment, brachial plexopathy or cervical radiculopathy in the left upper limb.  As you know, this particular electrodiagnostic study cannot rule out chemical radiculitis or sensory only radiculopathy.     Recommendations: 1.  Follow-up with referring physician. 2.  Continue current management of symptoms. 3.  Suggest MRI cervical spine to further rule out potential for radiculitis or sensory radiculopathy.  Consider diagnosis of complex regional pain syndrome.  [place] Criteria for CRPS  Complex Regional Pain Syndrome describes an array of painful conditions that are characterised by a continuing (spontaneous and/or evoked) regional pain that is seemingly disproportionate in time or degree to the usual course of any known trauma or other lesion. The pain is regional (not in a specific nerve territory or dermatome) and usually has a distal predominance of abnormal sensory, motor, sudomotor, vasomotor, and/or trophic findings. The syndrome shows variable progression over time.  To make the clinical diagnosis, the following criteria must be met:  . Continuing pain, which is disproportionate to any inciting event.  . Must report at least one symptom in all four of the following categories: o sensory - reports of hyperaesthesia and/or allodynia o  vasomotor - reports of temperature asymmetry and/or skin colour changes and/or skin colour asymmetry o sudomotor/oedema - reports of oedema and/or sweating changes and/or sweating asymmetry o motor/trophic - reports of decreased range of motion and/or motor dysfunction (weakness, tremor, dystonia) and/or trophic changes (hair, nail, skin).  . Must display at  least one sign at time of evaluation in two or more of the following categories: o sensory - evidence of hyperalgesia (to pinprick) and/or allodynia (to light touch and/or temperature sensation and/or deep somatic pressure and/or joint movement) o vasomotor - evidence of temperature asymmetry (>?1?C) and/or skin colour changes and/or asymmetry o sudomotor/oedema - evidence of oedema and/or sweating changes and/or sweating asymmetry o motor/trophic - evidence of decreased range of motion and/or motor dysfunction (weakness, tremor, dystonia) and/or trophic changes (hair, nail, skin)  There is no other diagnosis that better explains the signs and symptoms.  Meds & Orders: No orders of the defined types were placed in this encounter.   Orders Placed This Encounter  Procedures  . NCV with EMG (electromyography)    Follow-up: Return for Isabella Stalling, MD.   Procedures: No procedures performed  EMG & NCV Findings: Evaluation of the left median (across palm) sensory nerve showed prolonged distal peak latency (Wrist, 3.9 ms) and prolonged distal peak latency (Palm, 2.1 ms).  The left ulnar sensory nerve showed prolonged distal peak latency (4.1 ms) and decreased conduction velocity (Wrist-5th Digit, 34 m/s).  All remaining nerves (as indicated in the following tables) were within normal limits.    All examined muscles (as indicated in the following table) showed no evidence of electrical instability.    Impression: The above electrodiagnostic study is ABNORMAL and reveals evidence of a mild left median nerve entrapment at the wrist affecting sensory components.  There is likely some temperature artifact.    . There is no significant electrodiagnostic evidence of any other focal nerve entrapment, brachial plexopathy or cervical radiculopathy in the left upper limb.  As you know, this particular electrodiagnostic study cannot rule out chemical radiculitis or sensory only radiculopathy.      Recommendations: 1.  Follow-up with referring physician. 2.  Continue current management of symptoms. 3.  Suggest MRI cervical spine to further rule out potential for radiculitis or sensory radiculopathy.  Consider diagnosis of complex regional pain syndrome.  [place] Criteria for CRPS  Complex Regional Pain Syndrome describes an array of painful conditions that are characterised by a continuing (spontaneous and/or evoked) regional pain that is seemingly disproportionate in time or degree to the usual course of any known trauma or other lesion. The pain is regional (not in a specific nerve territory or dermatome) and usually has a distal predominance of abnormal sensory, motor, sudomotor, vasomotor, and/or trophic findings. The syndrome shows variable progression over time.  To make the clinical diagnosis, the following criteria must be met:  . Continuing pain, which is disproportionate to any inciting event.  . Must report at least one symptom in all four of the following categories: o sensory - reports of hyperaesthesia and/or allodynia o vasomotor - reports of temperature asymmetry and/or skin colour changes and/or skin colour asymmetry o sudomotor/oedema - reports of oedema and/or sweating changes and/or sweating asymmetry o motor/trophic - reports of decreased range of motion and/or motor dysfunction (weakness, tremor, dystonia) and/or trophic changes (hair, nail, skin).  . Must display at least one sign at time of evaluation in two or more of the following categories: o sensory - evidence of hyperalgesia (to pinprick)  and/or allodynia (to light touch and/or temperature sensation and/or deep somatic pressure and/or joint movement) o vasomotor - evidence of temperature asymmetry (>?1?C) and/or skin colour changes and/or asymmetry o sudomotor/oedema - evidence of oedema and/or sweating changes and/or sweating asymmetry o motor/trophic - evidence of decreased range of motion and/or  motor dysfunction (weakness, tremor, dystonia) and/or trophic changes (hair, nail, skin)  . There is no other diagnosis that better explains the signs and symptoms.  ___________________________ Laurence Spates FAAPMR Board Certified, American Board of Physical Medicine and Rehabilitation    Nerve Conduction Studies Anti Sensory Summary Table   Stim Site NR Peak (ms) Norm Peak (ms) P-T Amp (V) Norm P-T Amp Site1 Site2 Delta-P (ms) Dist (cm) Vel (m/s) Norm Vel (m/s)  Left Median Acr Palm Anti Sensory (2nd Digit)  30.1C  Wrist    *3.9 <3.6 32.3 >10 Wrist Palm 1.8 0.0    Palm    *2.1 <2.0 32.9         Left Radial Anti Sensory (Base 1st Digit)  30.5C  Wrist    2.1 <3.1 20.4  Wrist Base 1st Digit 2.1 0.0    Left Ulnar Anti Sensory (5th Digit)  30.2C  Wrist    *4.1 <3.7 23.6 >15.0 Wrist 5th Digit 4.1 14.0 *34 >38   Motor Summary Table   Stim Site NR Onset (ms) Norm Onset (ms) O-P Amp (mV) Norm O-P Amp Site1 Site2 Delta-0 (ms) Dist (cm) Vel (m/s) Norm Vel (m/s)  Left Axillary Motor (Deltoid)  29.9C  Clavicle    4.2 <5 4.7  Clavicle Deltoid 4.2 25.0 60   Left Median Motor (Abd Poll Brev)  30.5C  Wrist    3.8 <4.2 7.3 >5 Elbow Wrist 3.9 20.5 53 >50  Elbow    7.7  6.2         Left Ulnar Motor (Abd Dig Min)  30.2C  Wrist    3.5 <4.2 11.1 >3 B Elbow Wrist 3.8 20.0 53 >53  B Elbow    7.3  10.8  A Elbow B Elbow 1.8 10.0 56 >53  A Elbow    9.1  10.5          EMG   Side Muscle Nerve Root Ins Act Fibs Psw Amp Dur Poly Recrt Int Fraser Din Comment  Left 1stDorInt Ulnar C8-T1 Nml Nml Nml Nml Nml 0 Nml Nml   Left Abd Poll Brev Median C8-T1 Nml Nml Nml Nml Nml 0 Nml Nml   Left ExtDigCom   Nml Nml Nml Nml Nml 0 Nml Nml   Left Triceps Radial C6-7-8 Nml Nml Nml Nml Nml 0 Nml Nml   Left Deltoid Axillary C5-6 Nml Nml Nml Nml Nml 0 Nml Nml     Nerve Conduction Studies Anti Sensory Left/Right Comparison   Stim Site L Lat (ms) R Lat (ms) L-R Lat (ms) L Amp (V) R Amp (V) L-R Amp (%) Site1 Site2 L Vel  (m/s) R Vel (m/s) L-R Vel (m/s)  Median Acr Palm Anti Sensory (2nd Digit)  30.1C  Wrist *3.9   32.3   Wrist Palm     Palm *2.1   32.9         Radial Anti Sensory (Base 1st Digit)  30.5C  Wrist 2.1   20.4   Wrist Base 1st Digit     Ulnar Anti Sensory (5th Digit)  30.2C  Wrist *4.1   23.6   Wrist 5th Digit *34     Motor Left/Right Comparison   Stim Site  L Lat (ms) R Lat (ms) L-R Lat (ms) L Amp (mV) R Amp (mV) L-R Amp (%) Site1 Site2 L Vel (m/s) R Vel (m/s) L-R Vel (m/s)  Axillary Motor (Deltoid)  29.9C  Clavicle 4.2   4.7   Clavicle Deltoid 60    Median Motor (Abd Poll Brev)  30.5C  Wrist 3.8   7.3   Elbow Wrist 53    Elbow 7.7   6.2         Ulnar Motor (Abd Dig Min)  30.2C  Wrist 3.5   11.1   B Elbow Wrist 53    B Elbow 7.3   10.8   A Elbow B Elbow 56    A Elbow 9.1   10.5            Waveforms:              Clinical History: MRI OF THE LEFT SHOULDER WITHOUT CONTRAST  TECHNIQUE: Multiplanar, multisequence MR imaging of the shoulder was performed. No intravenous contrast was administered.  COMPARISON:  X-ray 10/07/2020  FINDINGS: Rotator cuff: Low-grade interstitial tear of the distal subscapularis tendon (series 10, images 13-16). Supraspinatus, infraspinatus, and teres minor tendons intact. No full thickness rotator cuff tear.  Muscles: Preserved bulk and signal intensity of the rotator cuff musculature without edema, atrophy, or fatty infiltration.  Biceps long head:  Intact.  Acromioclavicular Joint: Early mild degenerative changes of the AC joint. No significant subacromial-subdeltoid bursal fluid.  Glenohumeral Joint: Mild chondral thinning.  No joint effusion.  Labrum: Grossly intact, but evaluation is limited by lack of intraarticular fluid.  Bones:  No marrow abnormality, fracture or dislocation.  Other: None.  IMPRESSION: 1. 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 2. No full-thickness rotator cuff  tear. 3. Mild glenohumeral and AC joint osteoarthritis.   Electronically Signed   By: Davina Poke D.O.   On: 11/03/2020 16:20     Objective:  VS:  HT:    WT:   BMI:     BP:   HR: bpm  TEMP: ( )  RESP:  Physical Exam Musculoskeletal:        General: No tenderness.     Comments: Inspection reveals no atrophy of the bilateral APB or FDI or hand intrinsics.  Compared to the right the left arm does appear to have a much colder temperature he did have difficulty warming this during the test.  He did have some increased sweating on the left compared to right.  No color changes no allodynia.. There is 5 out of 5 strength in the bilateral wrist extension, finger abduction and long finger flexion.  He does not want to move the arm in abduction or forward flexion.  There is intact sensation to light touch in all dermatomal and peripheral nerve distributions.  There is a negative Spurling's test.  There is a negative Hoffmann's test bilaterally.  Skin:    General: Skin is warm and dry.     Findings: No erythema or rash.  Neurological:     General: No focal deficit present.     Mental Status: He is alert and oriented to person, place, and time.     Sensory: No sensory deficit.     Motor: No weakness or abnormal muscle tone.     Coordination: Coordination normal.     Gait: Gait normal.  Psychiatric:        Mood and Affect: Mood normal.        Behavior: Behavior normal.  Thought Content: Thought content normal.      Imaging: No results found.

## 2020-11-29 NOTE — Procedures (Signed)
EMG & NCV Findings: Evaluation of the left median (across palm) sensory nerve showed prolonged distal peak latency (Wrist, 3.9 ms) and prolonged distal peak latency (Palm, 2.1 ms).  The left ulnar sensory nerve showed prolonged distal peak latency (4.1 ms) and decreased conduction velocity (Wrist-5th Digit, 34 m/s).  All remaining nerves (as indicated in the following tables) were within normal limits.    All examined muscles (as indicated in the following table) showed no evidence of electrical instability.    Impression: The above electrodiagnostic study is ABNORMAL and reveals evidence of a mild left median nerve entrapment at the wrist affecting sensory components.  There is likely some temperature artifact.    . There is no significant electrodiagnostic evidence of any other focal nerve entrapment, brachial plexopathy or cervical radiculopathy in the left upper limb.  As you know, this particular electrodiagnostic study cannot rule out chemical radiculitis or sensory only radiculopathy.     Recommendations: 1.  Follow-up with referring physician. 2.  Continue current management of symptoms. 3.  Suggest MRI cervical spine to further rule out potential for radiculitis or sensory radiculopathy.  Consider diagnosis of complex regional pain syndrome.  [place] Criteria for CRPS  Complex Regional Pain Syndrome describes an array of painful conditions that are characterised by a continuing (spontaneous and/or evoked) regional pain that is seemingly disproportionate in time or degree to the usual course of any known trauma or other lesion. The pain is regional (not in a specific nerve territory or dermatome) and usually has a distal predominance of abnormal sensory, motor, sudomotor, vasomotor, and/or trophic findings. The syndrome shows variable progression over time.  To make the clinical diagnosis, the following criteria must be met:  . Continuing pain, which is disproportionate to any inciting  event.  . Must report at least one symptom in all four of the following categories: o sensory - reports of hyperaesthesia and/or allodynia o vasomotor - reports of temperature asymmetry and/or skin colour changes and/or skin colour asymmetry o sudomotor/oedema - reports of oedema and/or sweating changes and/or sweating asymmetry o motor/trophic - reports of decreased range of motion and/or motor dysfunction (weakness, tremor, dystonia) and/or trophic changes (hair, nail, skin).  . Must display at least one sign at time of evaluation in two or more of the following categories: o sensory - evidence of hyperalgesia (to pinprick) and/or allodynia (to light touch and/or temperature sensation and/or deep somatic pressure and/or joint movement) o vasomotor - evidence of temperature asymmetry (>?1?C) and/or skin colour changes and/or asymmetry o sudomotor/oedema - evidence of oedema and/or sweating changes and/or sweating asymmetry o motor/trophic - evidence of decreased range of motion and/or motor dysfunction (weakness, tremor, dystonia) and/or trophic changes (hair, nail, skin)  . There is no other diagnosis that better explains the signs and symptoms.  ___________________________ Laurence Spates FAAPMR Board Certified, American Board of Physical Medicine and Rehabilitation    Nerve Conduction Studies Anti Sensory Summary Table   Stim Site NR Peak (ms) Norm Peak (ms) P-T Amp (V) Norm P-T Amp Site1 Site2 Delta-P (ms) Dist (cm) Vel (m/s) Norm Vel (m/s)  Left Median Acr Palm Anti Sensory (2nd Digit)  30.1C  Wrist    *3.9 <3.6 32.3 >10 Wrist Palm 1.8 0.0    Palm    *2.1 <2.0 32.9         Left Radial Anti Sensory (Base 1st Digit)  30.5C  Wrist    2.1 <3.1 20.4  Wrist Base 1st Digit 2.1 0.0    Left  Ulnar Anti Sensory (5th Digit)  30.2C  Wrist    *4.1 <3.7 23.6 >15.0 Wrist 5th Digit 4.1 14.0 *34 >38   Motor Summary Table   Stim Site NR Onset (ms) Norm Onset (ms) O-P Amp (mV) Norm O-P Amp  Site1 Site2 Delta-0 (ms) Dist (cm) Vel (m/s) Norm Vel (m/s)  Left Axillary Motor (Deltoid)  29.9C  Clavicle    4.2 <5 4.7  Clavicle Deltoid 4.2 25.0 60   Left Median Motor (Abd Poll Brev)  30.5C  Wrist    3.8 <4.2 7.3 >5 Elbow Wrist 3.9 20.5 53 >50  Elbow    7.7  6.2         Left Ulnar Motor (Abd Dig Min)  30.2C  Wrist    3.5 <4.2 11.1 >3 B Elbow Wrist 3.8 20.0 53 >53  B Elbow    7.3  10.8  A Elbow B Elbow 1.8 10.0 56 >53  A Elbow    9.1  10.5          EMG   Side Muscle Nerve Root Ins Act Fibs Psw Amp Dur Poly Recrt Int Fraser Din Comment  Left 1stDorInt Ulnar C8-T1 Nml Nml Nml Nml Nml 0 Nml Nml   Left Abd Poll Brev Median C8-T1 Nml Nml Nml Nml Nml 0 Nml Nml   Left ExtDigCom   Nml Nml Nml Nml Nml 0 Nml Nml   Left Triceps Radial C6-7-8 Nml Nml Nml Nml Nml 0 Nml Nml   Left Deltoid Axillary C5-6 Nml Nml Nml Nml Nml 0 Nml Nml     Nerve Conduction Studies Anti Sensory Left/Right Comparison   Stim Site L Lat (ms) R Lat (ms) L-R Lat (ms) L Amp (V) R Amp (V) L-R Amp (%) Site1 Site2 L Vel (m/s) R Vel (m/s) L-R Vel (m/s)  Median Acr Palm Anti Sensory (2nd Digit)  30.1C  Wrist *3.9   32.3   Wrist Palm     Palm *2.1   32.9         Radial Anti Sensory (Base 1st Digit)  30.5C  Wrist 2.1   20.4   Wrist Base 1st Digit     Ulnar Anti Sensory (5th Digit)  30.2C  Wrist *4.1   23.6   Wrist 5th Digit *34     Motor Left/Right Comparison   Stim Site L Lat (ms) R Lat (ms) L-R Lat (ms) L Amp (mV) R Amp (mV) L-R Amp (%) Site1 Site2 L Vel (m/s) R Vel (m/s) L-R Vel (m/s)  Axillary Motor (Deltoid)  29.9C  Clavicle 4.2   4.7   Clavicle Deltoid 60    Median Motor (Abd Poll Brev)  30.5C  Wrist 3.8   7.3   Elbow Wrist 53    Elbow 7.7   6.2         Ulnar Motor (Abd Dig Min)  30.2C  Wrist 3.5   11.1   B Elbow Wrist 53    B Elbow 7.3   10.8   A Elbow B Elbow 56    A Elbow 9.1   10.5            Waveforms:

## 2020-11-30 ENCOUNTER — Ambulatory Visit: Payer: PRIVATE HEALTH INSURANCE

## 2020-12-04 ENCOUNTER — Encounter: Payer: Self-pay | Admitting: Physical Therapy

## 2020-12-07 ENCOUNTER — Encounter: Payer: Self-pay | Admitting: Physical Therapy

## 2020-12-11 DIAGNOSIS — M47819 Spondylosis without myelopathy or radiculopathy, site unspecified: Secondary | ICD-10-CM | POA: Diagnosis not present

## 2020-12-11 DIAGNOSIS — M5410 Radiculopathy, site unspecified: Secondary | ICD-10-CM | POA: Diagnosis not present

## 2020-12-11 DIAGNOSIS — M25512 Pain in left shoulder: Secondary | ICD-10-CM | POA: Diagnosis not present

## 2020-12-11 DIAGNOSIS — M4807 Spinal stenosis, lumbosacral region: Secondary | ICD-10-CM | POA: Diagnosis not present

## 2020-12-11 DIAGNOSIS — Z79899 Other long term (current) drug therapy: Secondary | ICD-10-CM | POA: Diagnosis not present

## 2020-12-15 ENCOUNTER — Other Ambulatory Visit (HOSPITAL_COMMUNITY): Payer: Self-pay

## 2020-12-15 DIAGNOSIS — E6609 Other obesity due to excess calories: Secondary | ICD-10-CM | POA: Diagnosis not present

## 2020-12-15 DIAGNOSIS — I10 Essential (primary) hypertension: Secondary | ICD-10-CM | POA: Diagnosis not present

## 2020-12-15 DIAGNOSIS — E782 Mixed hyperlipidemia: Secondary | ICD-10-CM | POA: Diagnosis not present

## 2020-12-15 DIAGNOSIS — F5104 Psychophysiologic insomnia: Secondary | ICD-10-CM | POA: Diagnosis not present

## 2020-12-15 DIAGNOSIS — Z6831 Body mass index (BMI) 31.0-31.9, adult: Secondary | ICD-10-CM | POA: Diagnosis not present

## 2020-12-15 DIAGNOSIS — K219 Gastro-esophageal reflux disease without esophagitis: Secondary | ICD-10-CM | POA: Diagnosis not present

## 2020-12-15 MED ORDER — ALBUTEROL SULFATE HFA 108 (90 BASE) MCG/ACT IN AERS
1.0000 | INHALATION_SPRAY | Freq: Four times a day (QID) | RESPIRATORY_TRACT | 2 refills | Status: AC | PRN
  Filled 2020-12-15: qty 18, 50d supply, fill #0

## 2020-12-15 MED ORDER — PANTOPRAZOLE SODIUM 40 MG PO TBEC
40.0000 mg | DELAYED_RELEASE_TABLET | Freq: Two times a day (BID) | ORAL | 1 refills | Status: AC
  Filled 2020-12-15 – 2021-10-01 (×2): qty 180, 90d supply, fill #0

## 2020-12-15 MED ORDER — ZOLPIDEM TARTRATE ER 12.5 MG PO TBCR
12.5000 mg | EXTENDED_RELEASE_TABLET | Freq: Every evening | ORAL | 0 refills | Status: DC | PRN
Start: 2020-12-15 — End: 2021-01-15
  Filled 2020-12-15: qty 30, 30d supply, fill #0

## 2020-12-18 ENCOUNTER — Other Ambulatory Visit (HOSPITAL_COMMUNITY): Payer: Self-pay

## 2020-12-19 DIAGNOSIS — E6609 Other obesity due to excess calories: Secondary | ICD-10-CM | POA: Diagnosis not present

## 2020-12-19 DIAGNOSIS — Z6831 Body mass index (BMI) 31.0-31.9, adult: Secondary | ICD-10-CM | POA: Diagnosis not present

## 2020-12-22 ENCOUNTER — Other Ambulatory Visit (HOSPITAL_COMMUNITY): Payer: Self-pay

## 2020-12-22 DIAGNOSIS — F5104 Psychophysiologic insomnia: Secondary | ICD-10-CM | POA: Diagnosis not present

## 2020-12-22 DIAGNOSIS — K219 Gastro-esophageal reflux disease without esophagitis: Secondary | ICD-10-CM | POA: Diagnosis not present

## 2020-12-22 DIAGNOSIS — I10 Essential (primary) hypertension: Secondary | ICD-10-CM | POA: Diagnosis not present

## 2020-12-22 DIAGNOSIS — J302 Other seasonal allergic rhinitis: Secondary | ICD-10-CM | POA: Diagnosis not present

## 2020-12-22 DIAGNOSIS — E782 Mixed hyperlipidemia: Secondary | ICD-10-CM | POA: Diagnosis not present

## 2020-12-22 DIAGNOSIS — Z0000 Encounter for general adult medical examination without abnormal findings: Secondary | ICD-10-CM | POA: Diagnosis not present

## 2020-12-22 DIAGNOSIS — E663 Overweight: Secondary | ICD-10-CM | POA: Diagnosis not present

## 2020-12-22 DIAGNOSIS — S46012D Strain of muscle(s) and tendon(s) of the rotator cuff of left shoulder, subsequent encounter: Secondary | ICD-10-CM | POA: Diagnosis not present

## 2020-12-22 MED ORDER — METOPROLOL TARTRATE 25 MG PO TABS
25.0000 mg | ORAL_TABLET | ORAL | 0 refills | Status: AC
  Filled 2020-12-22: qty 21, 14d supply, fill #0

## 2020-12-28 ENCOUNTER — Other Ambulatory Visit (HOSPITAL_COMMUNITY): Payer: Self-pay

## 2021-01-05 ENCOUNTER — Other Ambulatory Visit (HOSPITAL_COMMUNITY): Payer: Self-pay

## 2021-01-09 ENCOUNTER — Other Ambulatory Visit (HOSPITAL_COMMUNITY): Payer: Self-pay

## 2021-01-11 DIAGNOSIS — M4807 Spinal stenosis, lumbosacral region: Secondary | ICD-10-CM | POA: Diagnosis not present

## 2021-01-11 DIAGNOSIS — Z79899 Other long term (current) drug therapy: Secondary | ICD-10-CM | POA: Diagnosis not present

## 2021-01-11 DIAGNOSIS — M5410 Radiculopathy, site unspecified: Secondary | ICD-10-CM | POA: Diagnosis not present

## 2021-01-11 DIAGNOSIS — M25512 Pain in left shoulder: Secondary | ICD-10-CM | POA: Diagnosis not present

## 2021-01-11 DIAGNOSIS — M47819 Spondylosis without myelopathy or radiculopathy, site unspecified: Secondary | ICD-10-CM | POA: Diagnosis not present

## 2021-01-15 ENCOUNTER — Other Ambulatory Visit (HOSPITAL_COMMUNITY): Payer: Self-pay

## 2021-01-15 MED ORDER — ZOLPIDEM TARTRATE ER 12.5 MG PO TBCR
12.5000 mg | EXTENDED_RELEASE_TABLET | Freq: Every evening | ORAL | 0 refills | Status: DC | PRN
Start: 2021-01-15 — End: 2021-04-13
  Filled 2021-01-15: qty 90, 90d supply, fill #0

## 2021-01-16 ENCOUNTER — Other Ambulatory Visit (HOSPITAL_COMMUNITY): Payer: Self-pay

## 2021-01-17 DIAGNOSIS — Z79899 Other long term (current) drug therapy: Secondary | ICD-10-CM | POA: Diagnosis not present

## 2021-01-26 ENCOUNTER — Other Ambulatory Visit (HOSPITAL_COMMUNITY): Payer: Self-pay

## 2021-01-26 MED ORDER — ONDANSETRON HCL 4 MG PO TABS
4.0000 mg | ORAL_TABLET | Freq: Every day | ORAL | 1 refills | Status: DC | PRN
  Filled 2021-01-26 – 2021-04-15 (×2): qty 15, 15d supply, fill #0
  Filled 2021-05-13: qty 15, 15d supply, fill #1

## 2021-02-09 DIAGNOSIS — Z79899 Other long term (current) drug therapy: Secondary | ICD-10-CM | POA: Diagnosis not present

## 2021-02-09 DIAGNOSIS — M47819 Spondylosis without myelopathy or radiculopathy, site unspecified: Secondary | ICD-10-CM | POA: Diagnosis not present

## 2021-02-09 DIAGNOSIS — M4807 Spinal stenosis, lumbosacral region: Secondary | ICD-10-CM | POA: Diagnosis not present

## 2021-02-09 DIAGNOSIS — M5410 Radiculopathy, site unspecified: Secondary | ICD-10-CM | POA: Diagnosis not present

## 2021-02-12 DIAGNOSIS — Z79899 Other long term (current) drug therapy: Secondary | ICD-10-CM | POA: Diagnosis not present

## 2021-02-14 ENCOUNTER — Other Ambulatory Visit (HOSPITAL_COMMUNITY): Payer: Self-pay

## 2021-02-21 ENCOUNTER — Other Ambulatory Visit (HOSPITAL_COMMUNITY): Payer: Self-pay

## 2021-02-27 ENCOUNTER — Other Ambulatory Visit (HOSPITAL_COMMUNITY): Payer: Self-pay

## 2021-03-12 DIAGNOSIS — M4807 Spinal stenosis, lumbosacral region: Secondary | ICD-10-CM | POA: Diagnosis not present

## 2021-03-12 DIAGNOSIS — M25512 Pain in left shoulder: Secondary | ICD-10-CM | POA: Diagnosis not present

## 2021-03-12 DIAGNOSIS — M5410 Radiculopathy, site unspecified: Secondary | ICD-10-CM | POA: Diagnosis not present

## 2021-03-12 DIAGNOSIS — Z1331 Encounter for screening for depression: Secondary | ICD-10-CM | POA: Diagnosis not present

## 2021-03-12 DIAGNOSIS — M47819 Spondylosis without myelopathy or radiculopathy, site unspecified: Secondary | ICD-10-CM | POA: Diagnosis not present

## 2021-03-12 DIAGNOSIS — Z79899 Other long term (current) drug therapy: Secondary | ICD-10-CM | POA: Diagnosis not present

## 2021-03-14 DIAGNOSIS — Z79899 Other long term (current) drug therapy: Secondary | ICD-10-CM | POA: Diagnosis not present

## 2021-03-19 ENCOUNTER — Other Ambulatory Visit (HOSPITAL_COMMUNITY): Payer: Self-pay

## 2021-03-19 MED ORDER — SM LORATA-DINE D 10-240 MG PO TB24
1.0000 | ORAL_TABLET | Freq: Every day | ORAL | 0 refills | Status: DC
  Filled 2021-03-19 – 2021-03-26 (×4): qty 90, 90d supply, fill #0

## 2021-03-26 ENCOUNTER — Other Ambulatory Visit (HOSPITAL_COMMUNITY): Payer: Self-pay

## 2021-03-27 ENCOUNTER — Other Ambulatory Visit (HOSPITAL_COMMUNITY): Payer: Self-pay

## 2021-04-03 ENCOUNTER — Other Ambulatory Visit (HOSPITAL_COMMUNITY): Payer: Self-pay

## 2021-04-04 ENCOUNTER — Other Ambulatory Visit (HOSPITAL_COMMUNITY): Payer: Self-pay

## 2021-04-05 ENCOUNTER — Other Ambulatory Visit (HOSPITAL_COMMUNITY): Payer: Self-pay

## 2021-04-06 ENCOUNTER — Other Ambulatory Visit (HOSPITAL_COMMUNITY): Payer: Self-pay

## 2021-04-06 MED ORDER — ALBUTEROL SULFATE HFA 108 (90 BASE) MCG/ACT IN AERS
1.0000 | INHALATION_SPRAY | Freq: Four times a day (QID) | RESPIRATORY_TRACT | 2 refills | Status: AC | PRN
  Filled 2021-04-06: qty 18, 50d supply, fill #0

## 2021-04-10 ENCOUNTER — Other Ambulatory Visit (HOSPITAL_COMMUNITY): Payer: Self-pay

## 2021-04-11 ENCOUNTER — Other Ambulatory Visit (HOSPITAL_COMMUNITY): Payer: Self-pay

## 2021-04-12 ENCOUNTER — Other Ambulatory Visit (HOSPITAL_COMMUNITY): Payer: Self-pay

## 2021-04-12 DIAGNOSIS — M25512 Pain in left shoulder: Secondary | ICD-10-CM | POA: Diagnosis not present

## 2021-04-12 DIAGNOSIS — M4807 Spinal stenosis, lumbosacral region: Secondary | ICD-10-CM | POA: Diagnosis not present

## 2021-04-12 DIAGNOSIS — Z79899 Other long term (current) drug therapy: Secondary | ICD-10-CM | POA: Diagnosis not present

## 2021-04-12 DIAGNOSIS — M47819 Spondylosis without myelopathy or radiculopathy, site unspecified: Secondary | ICD-10-CM | POA: Diagnosis not present

## 2021-04-12 DIAGNOSIS — M5410 Radiculopathy, site unspecified: Secondary | ICD-10-CM | POA: Diagnosis not present

## 2021-04-13 ENCOUNTER — Ambulatory Visit (INDEPENDENT_AMBULATORY_CARE_PROVIDER_SITE_OTHER): Payer: PRIVATE HEALTH INSURANCE | Admitting: Physical Therapy

## 2021-04-13 ENCOUNTER — Other Ambulatory Visit: Payer: Self-pay

## 2021-04-13 ENCOUNTER — Encounter: Payer: Self-pay | Admitting: Physical Therapy

## 2021-04-13 ENCOUNTER — Other Ambulatory Visit (HOSPITAL_COMMUNITY): Payer: Self-pay

## 2021-04-13 DIAGNOSIS — M6281 Muscle weakness (generalized): Secondary | ICD-10-CM

## 2021-04-13 DIAGNOSIS — R29898 Other symptoms and signs involving the musculoskeletal system: Secondary | ICD-10-CM | POA: Diagnosis not present

## 2021-04-13 DIAGNOSIS — M25512 Pain in left shoulder: Secondary | ICD-10-CM | POA: Diagnosis not present

## 2021-04-13 DIAGNOSIS — G8929 Other chronic pain: Secondary | ICD-10-CM

## 2021-04-13 MED ORDER — ZOLPIDEM TARTRATE ER 12.5 MG PO TBCR
12.5000 mg | EXTENDED_RELEASE_TABLET | Freq: Every evening | ORAL | 0 refills | Status: DC | PRN
  Filled 2021-04-13: qty 90, 90d supply, fill #0

## 2021-04-13 NOTE — Therapy (Signed)
[facility] [address] [place] [address] [place], [place], [address] Phone: [phone]   Fax:  [phone]  Physical Therapy Evaluation  Patient Details  Name: Alex Cooke MRN: [id number] Date of Birth: [birth year] Referring Provider (PT): Jones Broom   Encounter Date: 04/13/2021   PT End of Session - 04/13/21 1622     Visit Number 1    Number of Visits 12    Date for PT Re-Evaluation 05/11/21    Authorization - Visit Number 1    Authorization - Number of Visits 12    PT Start Time 1445    PT Stop Time 1530    PT Time Calculation (min) 45 min    Activity Tolerance Patient tolerated treatment well    Behavior During Therapy [facility] for tasks assessed/performed             Past Medical History:  Diagnosis Date   Hypertension     Past Surgical History:  Procedure Laterality Date   INNER EAR SURGERY      There were no vitals filed for this visit.    Subjective Assessment - 04/13/21 1447     Subjective Pt works as an Museum/gallery exhibitions officer and was injured during a fall when one of the ambulance steps broke. Pt had Lt rotator cuff and labrum surgery 02/2021.Pt reports he did not have a repair, just a "clean out".  Pt has been attending PT at another site and is now transitioning to work conditioning    Limitations Lifting    Patient Stated Goals return to full duty work    Currently in Pain? No/denies                [facility] PT Assessment - 04/13/21 0001       Assessment   Medical Diagnosis s/p Lt rotator cuff repair    Referring Provider (PT) Jones Broom    Onset Date/Surgical Date 02/02/21    Hand Dominance Right    Next MD Visit 04/23/21    Prior Therapy work conditioning at another location      Balance Screen   Has the patient fallen in the past 6 months No      Prior Function   Level of Independence Independent    Vocation Full time employment   pt currently out of work due to injury   [employer] EMT       Observation/Other Assessments   Focus on Therapeutic Outcomes (FOTO)  n/a      Posture/Postural Control   Posture Comments rounded shoulders      ROM / Strength   AROM / PROM / Strength AROM;Strength      AROM   Overall AROM Comments bilat shoulder AROM [facility]      Strength   Strength Assessment Site Shoulder    Right/Left Shoulder Right;Left    Right Shoulder Flexion 5/5    Right Shoulder ABduction 5/5    Left Shoulder Flexion 4/5    Left Shoulder ABduction 4/5    Left Shoulder Internal Rotation 4+/5    Left Shoulder External Rotation 4+/5      Palpation   Palpation comment TTP Lt medial deltoid, crepitus with shoulder horizontal abduction, abduction      Special Tests   Other special tests pain with eccentric chest press                        Objective measurements completed on examination: See above findings.  [facility] Adult PT Treatment/Exercise - 04/13/21 0001       Exercises   Exercises --   see work conditioning flow sheet                    PT Education - 04/13/21 1534     Education Details PT POC and goals    Person(s) Educated Patient    Methods Explanation    Comprehension Verbalized understanding                 PT Long Term Goals - 04/13/21 1627       PT LONG TERM GOAL #1   Title Pt will improve Lt shoulder strength to 5/5 to return to work with decreased pain and difficulty    Time 4    Period Weeks    Status New    Target Date 05/11/21      PT LONG TERM GOAL #2   Title Pt will return to full duty work with pain <= 2/10    Time 4    Period Weeks    Status New    Target Date 05/11/21                    Plan - 04/13/21 1623     Clinical Impression Statement Pt is a 50 y/o male s/p rotator cuff surgery who is referred for work conditioning. Pt presents with decreased strength, endurance and functional activity tolerance and will benefit from skilled PT to address deficits and progress  towards return to work    Personal Factors and Comorbidities Profession    Examination-Activity Limitations Lift;Reach Overhead    Examination-Participation Restrictions Occupation    Stability/Clinical Decision Making Stable/Uncomplicated    Clinical Decision Making Low    Rehab Potential Good    PT Frequency 4x / week    PT Duration 4 weeks    PT Treatment/Interventions Cryotherapy;Moist Heat;Iontophoresis 4mg /ml Dexamethasone;Electrical Stimulation;Functional mobility training;Therapeutic activities;Therapeutic exercise;Neuromuscular re-education;Taping;Patient/family education;Manual techniques;Dry needling;Vasopneumatic Device    PT Next Visit Plan work conditioning per flowsheet    Consulted and Agree with Plan of Care Patient             Patient will benefit from skilled therapeutic intervention in order to improve the following deficits and impairments:  Pain, Impaired UE functional use, Postural dysfunction, Decreased strength, Decreased activity tolerance, Decreased endurance  Visit Diagnosis: Chronic left shoulder pain - Plan: PT plan of care cert/re-cert  Muscle weakness (generalized) - Plan: PT plan of care cert/re-cert  Other symptoms and signs involving the musculoskeletal system - Plan: PT plan of care cert/re-cert     Problem List There are no problems to display for this patient.  , PT 04/13/2021, 4:31 PM  [facility] [address] [place], [place], [place] Phone: [phone]   Fax:  [phone]  Name: Alex Cooke MRN: Peterson Ao Date of Birth: 1971-01-04

## 2021-04-14 ENCOUNTER — Other Ambulatory Visit (HOSPITAL_COMMUNITY): Payer: Self-pay

## 2021-04-14 MED ORDER — ZOLPIDEM TARTRATE ER 12.5 MG PO TBCR
12.5000 mg | EXTENDED_RELEASE_TABLET | Freq: Every evening | ORAL | 0 refills | Status: DC | PRN
  Filled 2021-04-14: qty 90, 90d supply, fill #0
  Filled 2021-07-06: qty 90, fill #0
  Filled 2021-07-12: qty 90, 90d supply, fill #0

## 2021-04-15 MED FILL — Metoprolol Succinate Tab ER 24HR 25 MG (Tartrate Equiv): ORAL | 90 days supply | Qty: 90 | Fill #0 | Status: AC

## 2021-04-16 ENCOUNTER — Other Ambulatory Visit: Payer: Self-pay

## 2021-04-16 ENCOUNTER — Other Ambulatory Visit (HOSPITAL_COMMUNITY): Payer: Self-pay

## 2021-04-16 ENCOUNTER — Ambulatory Visit (INDEPENDENT_AMBULATORY_CARE_PROVIDER_SITE_OTHER): Payer: PRIVATE HEALTH INSURANCE | Admitting: Physical Therapy

## 2021-04-16 DIAGNOSIS — Z79899 Other long term (current) drug therapy: Secondary | ICD-10-CM | POA: Diagnosis not present

## 2021-04-16 DIAGNOSIS — M6281 Muscle weakness (generalized): Secondary | ICD-10-CM

## 2021-04-16 DIAGNOSIS — R29898 Other symptoms and signs involving the musculoskeletal system: Secondary | ICD-10-CM | POA: Diagnosis not present

## 2021-04-16 DIAGNOSIS — G8929 Other chronic pain: Secondary | ICD-10-CM

## 2021-04-16 DIAGNOSIS — M25512 Pain in left shoulder: Secondary | ICD-10-CM

## 2021-04-16 NOTE — Therapy (Signed)
[facility] [place] [address] [place] [address] [place], [place], [address] Phone: [phone]   Fax:  [phone]  Physical Therapy Treatment  Patient Details  Name: Alex Cooke MRN: [id number] Date of Birth: 17-Jul-1971 Referring Provider (PT): Jones Broom   Encounter Date: 04/16/2021   PT End of Session - 04/16/21 0850     Visit Number 2    Number of Visits 12    Date for PT Re-Evaluation 05/11/21    Authorization - Visit Number 2    Authorization - Number of Visits 12    PT Start Time 0800    PT Stop Time 1000    PT Time Calculation (min) 120 min    Activity Tolerance Patient tolerated treatment well    Behavior During Therapy [facility] for tasks assessed/performed             Past Medical History:  Diagnosis Date   Hypertension     Past Surgical History:  Procedure Laterality Date   INNER EAR SURGERY      There were no vitals filed for this visit.   Subjective Assessment - 04/16/21 0810     Subjective Pt reports that he returns to MD next week.  No new changes since last visit.    Limitations Lifting    Patient Stated Goals return to full duty work    Currently in Pain? No/denies                [facility] PT Assessment - 04/16/21 0001       Assessment   Medical Diagnosis s/p Lt rotator cuff repair    Referring Provider (PT) Jones Broom    Onset Date/Surgical Date 02/02/21    Hand Dominance Right    Next MD Visit 04/23/21    Prior Therapy work conditioning at another location              [facility] Adult PT Treatment/Exercise - 04/16/21 0001       Exercises   Exercises --   see work conditioning flow sheet     Modalities   Modalities Cryotherapy      Cryotherapy   Number Minutes Cryotherapy 15 Minutes    Cryotherapy Location Shoulder    Type of Cryotherapy Ice pack             PT Long Term Goals - 04/13/21 1627       PT LONG TERM GOAL #1   Title Pt will improve Lt shoulder strength  to 5/5 to return to work with decreased pain and difficulty    Time 4    Period Weeks    Status New    Target Date 05/11/21      PT LONG TERM GOAL #2   Title Pt will return to full duty work with pain <= 2/10    Time 4    Period Weeks    Status New    Target Date 05/11/21                   Plan - 04/16/21 1152     Clinical Impression Statement Pt requires some minor cues for scap retraction during exercises.  He tolerated 2 hrs of strengthening exercises for work conditioning well.  He did not tolerate high position doorway stretch (~120) due to increase pain in lateral/posterior shoulder; switched to 90 pec stretch for improved tolerance. Weight kept under 20# due to restriction from MD (per pt).  Plan to progress to 3 hrs  of work conditioning next week.    Personal Factors and Comorbidities Profession    Examination-Activity Limitations Lift;Reach Overhead    Examination-Participation Restrictions Occupation    Stability/Clinical Decision Making Stable/Uncomplicated    Rehab Potential Good    PT Frequency 4x / week    PT Duration 4 weeks    PT Treatment/Interventions Cryotherapy;Moist Heat;Iontophoresis 4mg /ml Dexamethasone;Electrical Stimulation;Functional mobility training;Therapeutic activities;Therapeutic exercise;Neuromuscular re-education;Taping;Patient/family education;Manual techniques;Dry needling;Vasopneumatic Device    PT Next Visit Plan work conditioning per flowsheet    Consulted and Agree with Plan of Care Patient             Patient will benefit from skilled therapeutic intervention in order to improve the following deficits and impairments:  Pain, Impaired UE functional use, Postural dysfunction, Decreased strength, Decreased activity tolerance, Decreased endurance  Visit Diagnosis: Chronic left shoulder pain  Muscle weakness (generalized)  Other symptoms and signs involving the musculoskeletal system     Problem List There are no problems  to display for this patient.  , PTA 04/16/21 12:00 PM   [facility] [address] [place] [address] [place], [place], [place] Phone: [phone]   Fax:  [phone]  Name: Alex Cooke MRN: Peterson Ao Date of Birth: [birth year]

## 2021-04-17 ENCOUNTER — Ambulatory Visit (INDEPENDENT_AMBULATORY_CARE_PROVIDER_SITE_OTHER): Payer: PRIVATE HEALTH INSURANCE | Admitting: Physical Therapy

## 2021-04-17 DIAGNOSIS — G8929 Other chronic pain: Secondary | ICD-10-CM

## 2021-04-17 DIAGNOSIS — M6281 Muscle weakness (generalized): Secondary | ICD-10-CM

## 2021-04-17 DIAGNOSIS — M25512 Pain in left shoulder: Secondary | ICD-10-CM

## 2021-04-17 DIAGNOSIS — R29898 Other symptoms and signs involving the musculoskeletal system: Secondary | ICD-10-CM | POA: Diagnosis not present

## 2021-04-17 NOTE — Therapy (Signed)
[facility] [place] [address] [place] [address] [place], [place], [address] Phone: [phone]   Fax:  [phone]  Physical Therapy Treatment  Patient Details  Name: Alex Cooke MRN: [id number] Date of Birth: 02-20-1971 Referring Provider (PT): Jones Broom   Encounter Date: 04/17/2021   PT End of Session - 04/17/21 0852     Visit Number 3    Number of Visits 12    Date for PT Re-Evaluation 05/11/21    Authorization - Visit Number 3    Authorization - Number of Visits 12    PT Start Time 0845    PT Stop Time 1045    PT Time Calculation (min) 120 min    Activity Tolerance Patient tolerated treatment well    Behavior During Therapy [facility] for tasks assessed/performed             Past Medical History:  Diagnosis Date   Hypertension     Past Surgical History:  Procedure Laterality Date   INNER EAR SURGERY      There were no vitals filed for this visit.   Subjective Assessment - 04/17/21 0852     Subjective Pt reports he had some soreness of 2/10 yesterday afternoon.  Resolved with rest.    Limitations Lifting    Patient Stated Goals return to full duty work    Currently in Pain? No/denies                [facility] PT Assessment - 04/17/21 0001       Assessment   Medical Diagnosis s/p Lt rotator cuff repair    Referring Provider (PT) Jones Broom    Onset Date/Surgical Date 02/02/21    Hand Dominance Right    Next MD Visit 04/23/21    Prior Therapy work conditioning at another location              [facility] Adult PT Treatment/Exercise - 04/17/21 0001       Exercises   Exercises --   see work conditioning flow sheet     Modalities   Modalities Cryotherapy      Cryotherapy   Number Minutes Cryotherapy 10 Minutes    Cryotherapy Location Shoulder    Type of Cryotherapy Ice pack              PT Long Term Goals - 04/13/21 1627       PT LONG TERM GOAL #1   Title Pt will improve Lt shoulder  strength to 5/5 to return to work with decreased pain and difficulty    Time 4    Period Weeks    Status New    Target Date 05/11/21      PT LONG TERM GOAL #2   Title Pt will return to full duty work with pain <= 2/10    Time 4    Period Weeks    Status New    Target Date 05/11/21                   Plan - 04/17/21 1130     Clinical Impression Statement Some focus on cardiovascular endurance introduced today, to assist with return to work tasks. Pt given some cues for scap retraction for improved shoulder positioning during standing UE exercises.   Pt reported some soreness in Lt shoulder after completion of strengthening exercises; reduced with rest and ice at end of session.  Progressing towards LTGs.    Personal Factors and Comorbidities Profession  Examination-Activity Limitations Lift;Reach Overhead    Examination-Participation Restrictions Occupation    Stability/Clinical Decision Making Stable/Uncomplicated    Rehab Potential Good    PT Frequency 4x / week    PT Duration 4 weeks    PT Treatment/Interventions Cryotherapy;Moist Heat;Iontophoresis 4mg /ml Dexamethasone;Electrical Stimulation;Functional mobility training;Therapeutic activities;Therapeutic exercise;Neuromuscular re-education;Taping;Patient/family education;Manual techniques;Dry needling;Vasopneumatic Device    PT Next Visit Plan work conditioning per flowsheet. Measure LUE strength at 9/16 visit; continue at 2hrs of conditioning; progress to 3 hrs next week.  MD note on 9/16 for 9/19 appt.    Consulted and Agree with Plan of Care Patient             Patient will benefit from skilled therapeutic intervention in order to improve the following deficits and impairments:  Pain, Impaired UE functional use, Postural dysfunction, Decreased strength, Decreased activity tolerance, Decreased endurance  Visit Diagnosis: Chronic left shoulder pain  Muscle weakness (generalized)  Other symptoms and signs  involving the musculoskeletal system     Problem List There are no problems to display for this patient.   10/19, PTA 04/17/21 11:36 AM   [facility] [address] [place] [address] [place], [place], [place] Phone: [phone]   Fax:  [phone]  Name: KEMO SPRUCE MRN: Peterson Ao Date of Birth: Oct 21, 1970

## 2021-04-19 ENCOUNTER — Other Ambulatory Visit: Payer: Self-pay

## 2021-04-19 ENCOUNTER — Ambulatory Visit (INDEPENDENT_AMBULATORY_CARE_PROVIDER_SITE_OTHER): Payer: PRIVATE HEALTH INSURANCE | Admitting: Rehabilitative and Restorative Service Providers"

## 2021-04-19 ENCOUNTER — Encounter: Payer: Self-pay | Admitting: Rehabilitative and Restorative Service Providers"

## 2021-04-19 DIAGNOSIS — M6281 Muscle weakness (generalized): Secondary | ICD-10-CM

## 2021-04-19 DIAGNOSIS — M25612 Stiffness of left shoulder, not elsewhere classified: Secondary | ICD-10-CM

## 2021-04-19 DIAGNOSIS — M25512 Pain in left shoulder: Secondary | ICD-10-CM

## 2021-04-19 DIAGNOSIS — G8929 Other chronic pain: Secondary | ICD-10-CM

## 2021-04-19 DIAGNOSIS — R29898 Other symptoms and signs involving the musculoskeletal system: Secondary | ICD-10-CM | POA: Diagnosis not present

## 2021-04-19 DIAGNOSIS — R293 Abnormal posture: Secondary | ICD-10-CM

## 2021-04-19 NOTE — Therapy (Signed)
[facility] Out[facility] [place] [address] [place] [address] [place], [place], [address] Phone: [phone]   Fax:  [phone]  Physical Therapy Treatment  Patient Details  Name: Alex Cooke MRN: [id number] Date of Birth: [birth year] Referring Provider (PT): Jones Broom   Encounter Date: 04/19/2021   PT End of Session - 04/19/21 0852     Visit Number 4    Number of Visits 12    Date for PT Re-Evaluation 05/11/21    Authorization - Visit Number 4    Authorization - Number of Visits 12    PT Start Time 0850    PT Stop Time 1050    PT Time Calculation (min) 120 min    Activity Tolerance Patient tolerated treatment well             Past Medical History:  Diagnosis Date   Hypertension     Past Surgical History:  Procedure Laterality Date   INNER EAR SURGERY      There were no vitals filed for this visit.   Subjective Assessment - 04/19/21 0853     Subjective Patient reports some soreness but no pain. Doing OK with exercises and activities for work conditioning. Lying supine with noodle along spine feels really good. Interested in trial of taping for Lt shoulder which has helped in the past.    Currently in Pain? No/denies                               [facility] Adult PT Treatment/Exercise - 04/19/21 0001       Neuro Re-ed    Neuro Re-ed Details  working on standing posture and alignment back along wall PT assist for scapular retraction along the noodle; instructed in scap squeeze in standing with reach for floor to stretch upper traps      Exercises   Exercises --   see work conditioning flow sheet     Modalities   Modalities Cryotherapy      Cryotherapy   Number Minutes Cryotherapy 10 Minutes    Cryotherapy Location Shoulder    Type of Cryotherapy Ice pack                          PT Long Term Goals - 04/13/21 1627       PT LONG TERM GOAL #1   Title Pt will improve Lt shoulder  strength to 5/5 to return to work with decreased pain and difficulty    Time 4    Period Weeks    Status New    Target Date 05/11/21      PT LONG TERM GOAL #2   Title Pt will return to full duty work with pain <= 2/10    Time 4    Period Weeks    Status New    Target Date 05/11/21                   Plan - 04/19/21 0855     Clinical Impression Statement Some soreness from work conditioning tasks - no pain. Continued with cardiovascular activities and strengthening exercises/tasks including lifting, push, pull, carry. Trial of postural correction using foam roll/noodle pt standing with PT assist then in supine for prolonged snow angel. Patient has used taping for shoulder in the past and feels this helps with position for shoulders.    Rehab Potential Good    PT Frequency  4x / week    PT Duration 4 weeks    PT Treatment/Interventions Cryotherapy;Moist Heat;Iontophoresis 4mg /ml Dexamethasone;Electrical Stimulation;Functional mobility training;Therapeutic activities;Therapeutic exercise;Neuromuscular re-education;Taping;Patient/family education;Manual techniques;Dry needling;Vasopneumatic Device    PT Next Visit Plan work conditioning per flowsheet. Measure LUE strength at 9/16 visit; continue at 2hrs of conditioning; progress to 3 hrs next week. Trial of taping to improve posture and alignment/position for shoulder.  MD note on 9/16 for 9/19 appt.             Patient will benefit from skilled therapeutic intervention in order to improve the following deficits and impairments:     Visit Diagnosis: Chronic left shoulder pain  Muscle weakness (generalized)  Other symptoms and signs involving the musculoskeletal system  Stiffness of left shoulder, not elsewhere classified  Abnormal posture     Problem List There are no problems to display for this patient.   10/19, PT, MPH  04/19/2021, 11:19 AM  [facility] [address] [place], [place], [place] Phone: [phone]   Fax:  [phone]  Name: RAMEY SCHIFF MRN: Peterson Ao Date of Birth: [birth year]

## 2021-04-20 ENCOUNTER — Encounter: Payer: Self-pay | Admitting: Physical Therapy

## 2021-04-23 ENCOUNTER — Other Ambulatory Visit: Payer: Self-pay

## 2021-04-23 ENCOUNTER — Ambulatory Visit (INDEPENDENT_AMBULATORY_CARE_PROVIDER_SITE_OTHER): Payer: PRIVATE HEALTH INSURANCE | Admitting: Physical Therapy

## 2021-04-23 ENCOUNTER — Encounter: Payer: Self-pay | Admitting: Physical Therapy

## 2021-04-23 DIAGNOSIS — R293 Abnormal posture: Secondary | ICD-10-CM | POA: Diagnosis not present

## 2021-04-23 DIAGNOSIS — M25512 Pain in left shoulder: Secondary | ICD-10-CM | POA: Diagnosis not present

## 2021-04-23 DIAGNOSIS — G8929 Other chronic pain: Secondary | ICD-10-CM

## 2021-04-23 DIAGNOSIS — M6281 Muscle weakness (generalized): Secondary | ICD-10-CM

## 2021-04-23 DIAGNOSIS — R29898 Other symptoms and signs involving the musculoskeletal system: Secondary | ICD-10-CM

## 2021-04-23 NOTE — Therapy (Signed)
[facility] Outpatient Rehabilitation [place] [address] [place] [address] [place], [place], [address] Phone: [phone]   Fax:  [phone]  Physical Therapy Treatment  Patient Details  Name: Alex Cooke MRN: [id number] Date of Birth: [birth year] Referring Provider (PT): Jones Broom   Encounter Date: 04/23/2021   PT End of Session - 04/23/21 0832     Visit Number 5    Number of Visits 12    Date for PT Re-Evaluation 05/11/21    Authorization - Visit Number 5    Authorization - Number of Visits 12    PT Start Time 0800    PT Stop Time 1000    PT Time Calculation (min) 120 min    Activity Tolerance Patient tolerated treatment well    Behavior During Therapy [facility] for tasks assessed/performed             Past Medical History:  Diagnosis Date   Hypertension     Past Surgical History:  Procedure Laterality Date   INNER EAR SURGERY      There were no vitals filed for this visit.   Subjective Assessment - 04/23/21 0836     Subjective Pt reports he has more noticable soreness in his Lt shoulder (points to upper trap area, AC joint and lateral deltoid).  He'd like to try taping to the shoulder as this has helped his shoulder in the past.  He returns to MD today; he reports he doesn't feel like he'll be ready to return to work tasks after having 20# restriction for 2 months.    Currently in Pain? Yes    Pain Score 2     Pain Location Shoulder    Pain Orientation Left    Pain Descriptors / Indicators Sore    Pain Relieving Factors ?                [facility] PT Assessment - 04/23/21 0001       Assessment   Medical Diagnosis s/p Lt rotator cuff repair    Referring Provider (PT) Jones Broom    Onset Date/Surgical Date 02/02/21    Hand Dominance Right    Next MD Visit 04/23/21    Prior Therapy work conditioning at another location      Strength   Overall Strength Comments soreness in Lt shoulder with MMT    Left Shoulder Flexion 4+/5     Left Shoulder ABduction 4+/5    Left Shoulder Internal Rotation 4+/5    Left Shoulder External Rotation 4+/5              [facility] Adult PT Treatment/Exercise - 04/23/21 0001       Exercises   Exercises --   see work conditioning flow sheet in Media      Cryotherapy   Number Minutes Cryotherapy 10 Minutes    Cryotherapy Location Shoulder   Lt   Type of Cryotherapy Ice pack      Manual Therapy   Manual therapy comments I strip of reg Rock tape applied to bilat posterior shoulders from infraspinatus insertion towards rhomboid insertion with 25% stretch.  I strip of reg Rock tape in X pattern placed over Lt [facility] joint for decompression and increase in proprioception.              PT Long Term Goals - 04/23/21 0839       PT LONG TERM GOAL #1   Title Pt will improve Lt shoulder strength to 5/5 to return to work with  decreased pain and difficulty    Time 4    Period Weeks    Status On-going      PT LONG TERM GOAL #2   Title Pt will return to full duty work with pain <= 2/10    Time 4    Period Weeks    Status On-going                   Plan - 04/23/21 6283     Clinical Impression Statement Pt reporting soreness, but no pain, from work conditioning tasks of strengthening through lifting, pushing, pulling, and carrying (within 20# restriction).  Trial of postural correction taping for improved posture with functional tasks. Cardiovascular and exercise tolerance improving.  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    Rehab Potential Good    PT Frequency 4x / week    PT Duration 4 weeks    PT Treatment/Interventions Cryotherapy;Moist Heat;Iontophoresis 4mg /ml Dexamethasone;Electrical Stimulation;Functional mobility training;Therapeutic activities;Therapeutic exercise;Neuromuscular re-education;Taping;Patient/family education;Manual techniques;Dry needling;Vasopneumatic Device    PT Next Visit Plan work  conditioning per flowsheet.; progress to 3 hrs the week of 9/19. Assess response to taping to improve posture and alignment/position for shoulder.    Consulted and Agree with Plan of Care Patient             Patient will benefit from skilled therapeutic intervention in order to improve the following deficits and impairments:  Pain, Impaired UE functional use, Postural dysfunction, Decreased strength, Decreased activity tolerance, Decreased endurance  Visit Diagnosis: Chronic left shoulder pain  Muscle weakness (generalized)  Other symptoms and signs involving the musculoskeletal system  Abnormal posture     Problem List There are no problems to display for this patient.  10/19, PTA 04/23/21 10:10 AM  [facility] Out[facility] [place] [address] [place] [address] [place], [place], [place] Phone: [phone]   Fax:  [phone]  Name: Alex Cooke MRN: Alex Cooke Date of Birth: 10-14-1970

## 2021-04-24 ENCOUNTER — Ambulatory Visit (INDEPENDENT_AMBULATORY_CARE_PROVIDER_SITE_OTHER): Payer: PRIVATE HEALTH INSURANCE | Admitting: Physical Therapy

## 2021-04-24 ENCOUNTER — Encounter: Payer: Self-pay | Admitting: Physical Therapy

## 2021-04-24 DIAGNOSIS — M25512 Pain in left shoulder: Secondary | ICD-10-CM | POA: Diagnosis not present

## 2021-04-24 DIAGNOSIS — R293 Abnormal posture: Secondary | ICD-10-CM

## 2021-04-24 DIAGNOSIS — R29898 Other symptoms and signs involving the musculoskeletal system: Secondary | ICD-10-CM

## 2021-04-24 DIAGNOSIS — M6281 Muscle weakness (generalized): Secondary | ICD-10-CM | POA: Diagnosis not present

## 2021-04-24 DIAGNOSIS — G8929 Other chronic pain: Secondary | ICD-10-CM

## 2021-04-24 NOTE — Therapy (Signed)
[facility] Outpatient [facility] [place] [address] [place], [place], [address] Phone: [phone]   Fax:  [phone]  Physical Therapy Treatment  Patient Details  Name: Alex Cooke MRN: [id number] Date of Birth: [birth year] Referring Provider (PT): Jones Broom   Encounter Date: 04/24/2021   PT End of Session - 04/24/21 0825     Visit Number 6    Number of Visits 12    Date for PT Re-Evaluation 05/11/21    Authorization - Visit Number 6    Authorization - Number of Visits 12    PT Start Time 0805    PT Stop Time 1105    PT Time Calculation (min) 180 min    Activity Tolerance Patient tolerated treatment well    Behavior During Therapy [facility] for tasks assessed/performed             Past Medical History:  Diagnosis Date   Hypertension     Past Surgical History:  Procedure Laterality Date   INNER EAR SURGERY      There were no vitals filed for this visit.   Subjective Assessment - 04/24/21 0815     Subjective Pt reports that he had visit with surgeon yesterday.  There are no weight restrictions; he returns to MD in 1 month.  He reports that he is to complete 3 hr sessions of work hardening this wk, 4 hrs next week and return to work at full duty on Oct 3.    Patient Stated Goals return to full duty work    Currently in Pain? Yes    Pain Score 1     Pain Location Shoulder    Pain Orientation Left    Pain Descriptors / Indicators Sore    Aggravating Factors  pressure through shoulder, like push up    Pain Relieving Factors ?                [facility] PT Assessment - 04/24/21 0001       Assessment   Medical Diagnosis s/p Lt rotator cuff repair    Referring Provider (PT) Jones Broom    Onset Date/Surgical Date 02/02/21    Hand Dominance Right    Next MD Visit 05/23/21    Prior Therapy work conditioning at another location               [facility] Adult PT Treatment/Exercise - 04/24/21 0001       Exercises    Exercises --   see work conditioning flow sheet     Lumbar Exercises: Quadruped   Other Quadruped Lumbar Exercises bird dog x 5      Cryotherapy   Number Minutes Cryotherapy 10 Minutes    Cryotherapy Location Shoulder   Lt   Type of Cryotherapy Ice pack                          PT Long Term Goals - 04/23/21 0839       PT LONG TERM GOAL #1   Title Pt will improve Lt shoulder strength to 5/5 to return to work with decreased pain and difficulty    Time 4    Period Weeks    Status On-going      PT LONG TERM GOAL #2   Title Pt will return to full duty work with pain <= 2/10    Time 4    Period Weeks    Status On-going  Plan - 04/24/21 0827     Clinical Impression Statement Pt reports increased discomfort with pressure into Lt shoulder with wall push ups and quadruped position. Audible popping from Lt shoulder noted with birddog and prone resisted flexion (when moving from flexion to neutral).  Discomfort reduces with rest.  Continued cues required for improved posture/moving scapulas to more neutral position. Pt progressed to 3 hrs of work conditioning today.  Goals are ongoing.    Rehab Potential Good    PT Frequency 4x / week    PT Duration 4 weeks    PT Treatment/Interventions Cryotherapy;Moist Heat;Iontophoresis 4mg /ml Dexamethasone;Electrical Stimulation;Functional mobility training;Therapeutic activities;Therapeutic exercise;Neuromuscular re-education;Taping;Patient/family education;Manual techniques;Dry needling;Vasopneumatic Device    PT Next Visit Plan work conditioning per flowsheet.; progress to 4 hrs the week of 9/26.    Consulted and Agree with Plan of Care Patient             Patient will benefit from skilled therapeutic intervention in order to improve the following deficits and impairments:  Pain, Impaired UE functional use, Postural dysfunction, Decreased strength, Decreased activity tolerance, Decreased  endurance  Visit Diagnosis: Chronic left shoulder pain  Muscle weakness (generalized)  Other symptoms and signs involving the musculoskeletal system  Abnormal posture     Problem List There are no problems to display for this patient.  10/26, PTA 04/24/21 1:11 PM  [facility] Outpatient Rehabilitation [place] [address] [place] [address] [place], [place], [place] Phone: [phone]   Fax:  [phone]  Name: JESTIN BURBACH MRN: Peterson Ao Date of Birth: 1970-09-09

## 2021-04-25 ENCOUNTER — Other Ambulatory Visit (HOSPITAL_COMMUNITY): Payer: Self-pay

## 2021-04-26 ENCOUNTER — Other Ambulatory Visit: Payer: Self-pay

## 2021-04-26 ENCOUNTER — Ambulatory Visit (INDEPENDENT_AMBULATORY_CARE_PROVIDER_SITE_OTHER): Payer: PRIVATE HEALTH INSURANCE | Admitting: Physical Therapy

## 2021-04-26 DIAGNOSIS — M25512 Pain in left shoulder: Secondary | ICD-10-CM

## 2021-04-26 DIAGNOSIS — G8929 Other chronic pain: Secondary | ICD-10-CM

## 2021-04-26 DIAGNOSIS — M6281 Muscle weakness (generalized): Secondary | ICD-10-CM

## 2021-04-26 DIAGNOSIS — R293 Abnormal posture: Secondary | ICD-10-CM | POA: Diagnosis not present

## 2021-04-26 DIAGNOSIS — R29898 Other symptoms and signs involving the musculoskeletal system: Secondary | ICD-10-CM | POA: Diagnosis not present

## 2021-04-26 NOTE — Therapy (Signed)
[facility] [place] [address] [place], [place], [address] Phone: [phone]   Fax:  [phone]  Physical Therapy Treatment  Patient Details  Name: Alex Cooke MRN: [id number] Date of Birth: Jun 13, 1971 Referring Provider (PT): Jones Broom   Encounter Date: 04/26/2021   PT End of Session - 04/26/21 1156     Visit Number 7    Number of Visits 12    Date for PT Re-Evaluation 05/11/21    Authorization - Visit Number 7    Authorization - Number of Visits 12    PT Start Time 0803    PT Stop Time 1103    PT Time Calculation (min) 180 min    Activity Tolerance Patient tolerated treatment well    Behavior During Therapy [facility] for tasks assessed/performed             Past Medical History:  Diagnosis Date   Hypertension     Past Surgical History:  Procedure Laterality Date   INNER EAR SURGERY      There were no vitals filed for this visit.   Subjective Assessment - 04/26/21 0809     Subjective Pt has had to go to work after physical therapy most days and is more fatigued and sore.    Patient Stated Goals return to full duty work    Currently in Pain? Yes    Pain Score 3     Pain Location Shoulder    Pain Orientation Left    Pain Descriptors / Indicators Sore                [facility] PT Assessment - 04/26/21 0001       Assessment   Medical Diagnosis s/p Lt rotator cuff repair    Referring Provider (PT) Jones Broom    Onset Date/Surgical Date 02/02/21    Hand Dominance Right    Next MD Visit 05/23/21    Prior Therapy work conditioning at another location                           [facility] Adult PT Treatment/Exercise - 04/26/21 0001       Exercises   Exercises --   see work conditioning flow sheet     Cryotherapy   Number Minutes Cryotherapy 10 Minutes    Cryotherapy Location Shoulder   Lt   Type of Cryotherapy Ice pack                          PT Long  Term Goals - 04/23/21 0839       PT LONG TERM GOAL #1   Title Pt will improve Lt shoulder strength to 5/5 to return to work with decreased pain and difficulty    Time 4    Period Weeks    Status On-going      PT LONG TERM GOAL #2   Title Pt will return to full duty work with pain <= 2/10    Time 4    Period Weeks    Status On-going                   Plan - 04/26/21 1156     Clinical Impression Statement Pt continues to progress well with work conditioning program. Some discomfort with pressure during wall push ups which resolves when out of position    PT Next Visit Plan work conditioning per flowsheet.; progress  to 4 hrs the week of 9/26.    Consulted and Agree with Plan of Care Patient             Patient will benefit from skilled therapeutic intervention in order to improve the following deficits and impairments:     Visit Diagnosis: Chronic left shoulder pain  Muscle weakness (generalized)  Other symptoms and signs involving the musculoskeletal system  Abnormal posture     Problem List There are no problems to display for this patient.   Shaquayla Klimas, PT 04/26/2021, 11:59 AM  [facility] [address] [place] [address] [place], [place], [address] Phone: [phone]   Fax:  [phone]  Name: RANJIT ASHURST MRN: [id number] Date of Birth: 1971/07/19

## 2021-04-27 ENCOUNTER — Encounter: Payer: Self-pay | Admitting: Physical Therapy

## 2021-04-27 ENCOUNTER — Ambulatory Visit (INDEPENDENT_AMBULATORY_CARE_PROVIDER_SITE_OTHER): Payer: PRIVATE HEALTH INSURANCE | Admitting: Physical Therapy

## 2021-04-27 DIAGNOSIS — M25512 Pain in left shoulder: Secondary | ICD-10-CM | POA: Diagnosis not present

## 2021-04-27 DIAGNOSIS — M6281 Muscle weakness (generalized): Secondary | ICD-10-CM

## 2021-04-27 DIAGNOSIS — R29898 Other symptoms and signs involving the musculoskeletal system: Secondary | ICD-10-CM | POA: Diagnosis not present

## 2021-04-27 DIAGNOSIS — R293 Abnormal posture: Secondary | ICD-10-CM

## 2021-04-27 DIAGNOSIS — G8929 Other chronic pain: Secondary | ICD-10-CM

## 2021-04-27 NOTE — Therapy (Signed)
[facility] Out[facility] [place] [address] [place] [place], [place], [address] Phone: [phone]   Fax:  [phone]  Physical Therapy Treatment  Patient Details  Name: Alex Cooke MRN: [id number] Date of Birth: Mar 05, 1971 Referring Provider (PT): Jones Broom   Encounter Date: 04/27/2021   PT End of Session - 04/27/21 0906     Visit Number 8    Number of Visits 12    Date for PT Re-Evaluation 05/11/21    Authorization - Visit Number 8    Authorization - Number of Visits 12    PT Start Time 0808    PT Stop Time 1108    PT Time Calculation (min) 180 min    Activity Tolerance Patient tolerated treatment well    Behavior During Therapy [facility] for tasks assessed/performed             Past Medical History:  Diagnosis Date   Hypertension     Past Surgical History:  Procedure Laterality Date   INNER EAR SURGERY      There were no vitals filed for this visit.   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  He feels very tired this morning. He reports that his Lt shoulder was more painful when driving for work yesterday.  At rest his shoulder pain is 1-2/10 and increases to 4/10 when working/exercising.    Patient Stated Goals return to full duty work    Currently in Pain? Yes    Pain Score 2     Pain Location Shoulder    Pain Orientation Left    Pain Descriptors / Indicators Sore    Aggravating Factors  pressure through shoulder (WB), and driving    Pain Relieving Factors ice                [facility] PT Assessment - 04/27/21 0001       Assessment   Medical Diagnosis s/p Lt rotator cuff repair    Referring Provider (PT) Jones Broom    Onset Date/Surgical Date 02/02/21    Hand Dominance Right    Next MD Visit 05/23/21    Prior Therapy work conditioning at another location                [facility] Adult PT Treatment/Exercise - 04/27/21 0001        Exercises   Exercises --   see work conditioning flow sheet     Cryotherapy   Number Minutes Cryotherapy 10 Minutes    Cryotherapy Location Shoulder   Lt   Type of Cryotherapy Ice pack              PT Long Term Goals - 04/23/21 0839       PT LONG TERM GOAL #1   Title Pt will improve Lt shoulder strength to 5/5 to return to work with decreased pain and difficulty    Time 4    Period Weeks    Status On-going      PT LONG TERM GOAL #2   Title Pt will return to full duty work with pain <= 2/10    Time 4    Period Weeks    Status On-going                   Plan - 04/27/21 0912     Clinical Impression Statement Pt progressing with amount of weight and resistance for box lift  and UE exercises.  Also progressing with cardiovascular endurance. Ice at end of session provides some relief of Lt shoulder soreness.  Will progress to 4 hrs work conditioning next week.    Rehab Potential Good    PT Frequency 4x / week    PT Duration 4 weeks    PT Treatment/Interventions Cryotherapy;Moist Heat;Iontophoresis 4mg /ml Dexamethasone;Electrical Stimulation;Functional mobility training;Therapeutic activities;Therapeutic exercise;Neuromuscular re-education;Taping;Patient/family education;Manual techniques;Dry needling;Vasopneumatic Device    PT Next Visit Plan work conditioning per flowsheet.; progress to 4 hrs the week of 9/26.    Consulted and Agree with Plan of Care Patient             Patient will benefit from skilled therapeutic intervention in order to improve the following deficits and impairments:  Pain, Impaired UE functional use, Postural dysfunction, Decreased strength, Decreased activity tolerance, Decreased endurance  Visit Diagnosis: Chronic left shoulder pain  Muscle weakness (generalized)  Other symptoms and signs involving the musculoskeletal system  Abnormal posture     Problem List There are no problems to display for this patient.  10/26, PTA 04/27/21 9:20 AM  [facility] [address] [place] [address] [place], [place], [place] Phone: [phone]   Fax:  [phone]  Name: Alex Cooke MRN: Peterson Ao Date of Birth: 1970-08-30

## 2021-04-30 ENCOUNTER — Ambulatory Visit (INDEPENDENT_AMBULATORY_CARE_PROVIDER_SITE_OTHER): Payer: PRIVATE HEALTH INSURANCE | Admitting: Physical Therapy

## 2021-04-30 ENCOUNTER — Other Ambulatory Visit: Payer: Self-pay

## 2021-04-30 DIAGNOSIS — M25512 Pain in left shoulder: Secondary | ICD-10-CM | POA: Diagnosis not present

## 2021-04-30 DIAGNOSIS — R29898 Other symptoms and signs involving the musculoskeletal system: Secondary | ICD-10-CM | POA: Diagnosis not present

## 2021-04-30 DIAGNOSIS — M6281 Muscle weakness (generalized): Secondary | ICD-10-CM

## 2021-04-30 DIAGNOSIS — R293 Abnormal posture: Secondary | ICD-10-CM | POA: Diagnosis not present

## 2021-04-30 DIAGNOSIS — G8929 Other chronic pain: Secondary | ICD-10-CM

## 2021-04-30 DIAGNOSIS — M25612 Stiffness of left shoulder, not elsewhere classified: Secondary | ICD-10-CM

## 2021-04-30 NOTE — Therapy (Signed)
[place], [place], [address] Phone: [phone]   Fax:  [phone]  Physical Therapy Treatment  Patient Details  Name: Alex Cooke MRN: [id number] Date of Birth: June 11, 1971 Referring Provider (PT): Tania Ade   Encounter Date: 04/30/2021   PT End of Session - 04/30/21 0814     Visit Number 9    Number of Visits 12    Date for PT Re-Evaluation 05/11/21    Authorization - Visit Number 9    Authorization - Number of Visits 12    PT Start Time 0800    PT Stop Time 1200    PT Time Calculation (min) 240 min    Activity Tolerance Patient tolerated treatment well    Behavior During Therapy [facility] for tasks assessed/performed             Past Medical History:  Diagnosis Date   Hypertension     Past Surgical History:  Procedure Laterality Date   INNER EAR SURGERY      There were no vitals filed for this visit.   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  "I'm just sore and tight". He reports that he is able to bear weight through arm (counter plank/ wall push up) with much less pain.  He continues to have pain with lifting overhead.    Patient Stated Goals return to full duty work    Currently in Pain? Yes    Pain Score 1     Pain Location Shoulder    Pain Orientation Left    Pain Descriptors / Indicators Sore    Aggravating Factors  turning wheel while driving    Pain Relieving Factors ice, copper sleeve                [facility] PT Assessment - 04/30/21 0001       Assessment   Medical Diagnosis s/p Lt rotator cuff repair    Referring Provider (PT) Tania Ade    Onset Date/Surgical Date 02/02/21    Hand Dominance Right    Next MD Visit 05/23/21    Prior Therapy work conditioning at another location      Strength   Left Shoulder  Flexion --   5-/5   Left Shoulder ABduction 4+/5   with pain in superior shoulder   Left Shoulder Internal Rotation 4+/5    Left Shoulder External Rotation 5/5               [facility] Adult PT Treatment/Exercise - 04/30/21 0001       Exercises   Exercises --   see work conditioning flow sheet     Cryotherapy   Number Minutes Cryotherapy 10 Minutes    Cryotherapy Location Shoulder   Lt   Type of Cryotherapy Ice pack      Manual Therapy   Manual therapy comments I strip of reg Rock tape applied to Left posterior shoulders from infraspinatus insertion towards rhomboid insertion with 25% stretch.  I strip of reg Rock tape in X pattern placed over Lt [facility] joint for decompression and increase in proprioception.              PT Long Term Goals - 04/30/21 7939       PT LONG TERM GOAL #1   Title Pt will improve Lt shoulder strength to 5/5 to return  to work with decreased pain and difficulty    Time 4    Period Weeks    Status Partially Met      PT LONG TERM GOAL #2   Title Pt will return to full duty work with pain <= 2/10    Time 4    Period Weeks    Status On-going                   Plan - 04/30/21 0913     Clinical Impression Statement Pt demonstrating good improvement in Lt shoulder strength and improved ability to bear weight through shoulder with less pain; has partially met his strength goal.  He is tolerating increase in time of work conditioning well.    Rehab Potential Good    PT Frequency 4x / week    PT Duration 4 weeks    PT Treatment/Interventions Cryotherapy;Moist Heat;Iontophoresis 86m/ml Dexamethasone;Electrical Stimulation;Functional mobility training;Therapeutic activities;Therapeutic exercise;Neuromuscular re-education;Taping;Patient/family education;Manual techniques;Dry needling;Vasopneumatic Device    PT Next Visit Plan work conditioning per flowsheet.;  4 hrs the week of 9/26.    Consulted and Agree with Plan of Care Patient              Patient will benefit from skilled therapeutic intervention in order to improve the following deficits and impairments:  Pain, Impaired UE functional use, Postural dysfunction, Decreased strength, Decreased activity tolerance, Decreased endurance  Visit Diagnosis: Chronic left shoulder pain  Muscle weakness (generalized)  Other symptoms and signs involving the musculoskeletal system  Abnormal posture  Stiffness of left shoulder, not elsewhere classified     Problem List There are no problems to display for this patient.  JKerin Perna PTA 04/30/21 12:07PM   [place] [address]Phone: [phone]  Fax:  [phone] Name: Alex YAFFEMRN: [id number] of Birth: [birth year]

## 2021-05-01 ENCOUNTER — Encounter: Payer: PRIVATE HEALTH INSURANCE | Admitting: Physical Therapy

## 2021-05-02 ENCOUNTER — Other Ambulatory Visit: Payer: Self-pay

## 2021-05-02 ENCOUNTER — Ambulatory Visit (INDEPENDENT_AMBULATORY_CARE_PROVIDER_SITE_OTHER): Payer: PRIVATE HEALTH INSURANCE | Admitting: Physical Therapy

## 2021-05-02 DIAGNOSIS — M25612 Stiffness of left shoulder, not elsewhere classified: Secondary | ICD-10-CM

## 2021-05-02 DIAGNOSIS — M6281 Muscle weakness (generalized): Secondary | ICD-10-CM | POA: Diagnosis not present

## 2021-05-02 DIAGNOSIS — M25512 Pain in left shoulder: Secondary | ICD-10-CM

## 2021-05-02 DIAGNOSIS — R293 Abnormal posture: Secondary | ICD-10-CM

## 2021-05-02 DIAGNOSIS — G8929 Other chronic pain: Secondary | ICD-10-CM

## 2021-05-02 DIAGNOSIS — R29898 Other symptoms and signs involving the musculoskeletal system: Secondary | ICD-10-CM

## 2021-05-02 NOTE — Therapy (Signed)
[facility] [place] [address] [place] [address] [place], [place], [address] Phone: [phone]   Fax:  [phone]  Physical Therapy Treatment  Patient Details  Name: Alex Cooke MRN: [id number] Date of Birth: 09-26-1970 Referring Provider (PT): Jones Broom   Encounter Date: 05/02/2021   PT End of Session - 05/02/21 1335     Visit Number 10    Number of Visits 12    Date for PT Re-Evaluation 05/11/21    Authorization - Visit Number 10    Authorization - Number of Visits 12    PT Start Time 0800    PT Stop Time 1200    PT Time Calculation (min) 240 min    Activity Tolerance Patient tolerated treatment well    Behavior During Therapy [facility] for tasks assessed/performed             Past Medical History:  Diagnosis Date   Hypertension     Past Surgical History:  Procedure Laterality Date   INNER EAR SURGERY      There were no vitals filed for this visit.   Subjective Assessment - 05/02/21 0806     Subjective Pt states that work is "going pretty well". He is still on light duty. He feels "sore" after work but not "pain"    Patient Stated Goals return to full duty work    Currently in Pain? No/denies                               [facility] Adult PT Treatment/Exercise - 05/02/21 0001       Exercises   Exercises --   see work conditioning flow sheet     Cryotherapy   Number Minutes Cryotherapy 10 Minutes    Cryotherapy Location Shoulder   Lt   Type of Cryotherapy Ice pack                          PT Long Term Goals - 04/30/21 2353       PT LONG TERM GOAL #1   Title Pt will improve Lt shoulder strength to 5/5 to return to work with decreased pain and difficulty    Time 4    Period Weeks    Status On-going      PT LONG TERM GOAL #2   Title Pt will return to full duty work with pain <= 2/10    Time 4    Period Weeks    Status On-going                   Plan -  05/02/21 1335     Clinical Impression Statement Pt continues to progress well through work conditioning program for 4 hours. Improved ability to bear wt and reach overhead with decreased pain    PT Next Visit Plan work conditioning per flowsheet.;  4 hrs the week of 9/26.    Consulted and Agree with Plan of Care Patient             Patient will benefit from skilled therapeutic intervention in order to improve the following deficits and impairments:     Visit Diagnosis: Chronic left shoulder pain  Muscle weakness (generalized)  Other symptoms and signs involving the musculoskeletal system  Abnormal posture  Stiffness of left shoulder, not elsewhere classified     Problem List There are no problems to display for this patient.  Ernan Runkles, PT 05/02/2021, 1:37 PM  [facility] [address] [place], [place], [address] Phone: [phone]   Fax:  [phone]  Name: Alex Cooke MRN: [id number] Date of Birth: 1971-03-24

## 2021-05-03 ENCOUNTER — Ambulatory Visit (INDEPENDENT_AMBULATORY_CARE_PROVIDER_SITE_OTHER): Payer: PRIVATE HEALTH INSURANCE | Admitting: Rehabilitative and Restorative Service Providers"

## 2021-05-03 ENCOUNTER — Encounter: Payer: Self-pay | Admitting: Rehabilitative and Restorative Service Providers"

## 2021-05-03 DIAGNOSIS — R29898 Other symptoms and signs involving the musculoskeletal system: Secondary | ICD-10-CM

## 2021-05-03 DIAGNOSIS — M6281 Muscle weakness (generalized): Secondary | ICD-10-CM

## 2021-05-03 DIAGNOSIS — M25512 Pain in left shoulder: Secondary | ICD-10-CM

## 2021-05-03 DIAGNOSIS — R293 Abnormal posture: Secondary | ICD-10-CM

## 2021-05-03 DIAGNOSIS — G8929 Other chronic pain: Secondary | ICD-10-CM

## 2021-05-03 DIAGNOSIS — M25612 Stiffness of left shoulder, not elsewhere classified: Secondary | ICD-10-CM

## 2021-05-03 NOTE — Therapy (Signed)
[facility] Outpatient Rehabilitation [place] [address] [place], [place], [address] Phone: [phone]   Fax:  [phone]  Physical Therapy Treatment  Patient Details  Name: Alex Cooke MRN: [id number] Date of Birth: 1971-02-07 Referring Provider (PT): Jones Broom   Encounter Date: 05/03/2021   PT End of Session - 05/03/21 0804     Visit Number 11    Number of Visits 12    Date for PT Re-Evaluation 05/11/21    Authorization - Visit Number 11    Authorization - Number of Visits 12    PT Start Time 0800    PT Stop Time 1200    PT Time Calculation (min) 240 min    Activity Tolerance Patient tolerated treatment well             Past Medical History:  Diagnosis Date   Hypertension     Past Surgical History:  Procedure Laterality Date   INNER EAR SURGERY      There were no vitals filed for this visit.   Subjective Assessment - 05/03/21 0804     Subjective Patient continues at work light duty until Monday. 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 Soem ongoing soreness. Less pain overall. Some trouble sleeping at night. Can't get comfortable. Has tried sleeping on the Rt shoulder but that is getting better as well. Most concerned with patient transfer bed to stretcher. Pain with moving rings over on arc.    Limitations Lifting    Patient Stated Goals return to full duty work    Currently in Pain? Yes    Pain Score 1     Pain Location Shoulder    Pain Orientation Left    Pain Descriptors / Indicators Sore                               [facility] Adult PT Treatment/Exercise - 05/03/21 0001       Exercises   Exercises --   see work conditioning flow sheet                         PT Long Term Goals - 04/30/21 0822       PT LONG TERM GOAL #1   Title Pt will improve Lt shoulder strength to 5/5 to return to work with decreased pain and  difficulty    Time 4    Period Weeks    Status On-going      PT LONG TERM GOAL #2   Title Pt will return to full duty work with pain <= 2/10    Time 4    Period Weeks    Status On-going                   Plan - 05/03/21 0809     Clinical Impression Statement Work conditioning 4 hours preparing to return to full duty on Monday 05/07/21. Reproduced patient pull with sheet and weights(115#) to practice patient transfer. Substituted shoulder flexion lift from wall in varying angles for ring arc to avoid impingement position Lt shoulder which was creating pain.    Rehab Potential Good    PT Frequency 4x / week    PT Duration 4 weeks    PT Treatment/Interventions Cryotherapy;Moist Heat;Iontophoresis 4mg /ml Dexamethasone;Electrical Stimulation;Functional mobility training;Therapeutic activities;Therapeutic exercise;Neuromuscular re-education;Taping;Patient/family education;Manual techniques;Dry needling;Vasopneumatic Device    PT  Next Visit Plan work conditioning per flowsheet    Consulted and Agree with Plan of Care Patient             Patient will benefit from skilled therapeutic intervention in order to improve the following deficits and impairments:     Visit Diagnosis: Chronic left shoulder pain  Muscle weakness (generalized)  Other symptoms and signs involving the musculoskeletal system  Abnormal posture  Stiffness of left shoulder, not elsewhere classified     Problem List There are no problems to display for this patient.   Val Riles, PT, MPH  05/03/2021, 12:52 PM  [facility] [address] [place], [place], [address] Phone: [phone]   Fax:  [phone]  Name: Alex Cooke MRN: [id number] Date of Birth: [birth year]

## 2021-05-04 ENCOUNTER — Ambulatory Visit (INDEPENDENT_AMBULATORY_CARE_PROVIDER_SITE_OTHER): Payer: PRIVATE HEALTH INSURANCE | Admitting: Physical Therapy

## 2021-05-04 ENCOUNTER — Other Ambulatory Visit: Payer: Self-pay

## 2021-05-04 DIAGNOSIS — M25512 Pain in left shoulder: Secondary | ICD-10-CM

## 2021-05-04 DIAGNOSIS — M25612 Stiffness of left shoulder, not elsewhere classified: Secondary | ICD-10-CM

## 2021-05-04 DIAGNOSIS — R293 Abnormal posture: Secondary | ICD-10-CM

## 2021-05-04 DIAGNOSIS — M6281 Muscle weakness (generalized): Secondary | ICD-10-CM | POA: Diagnosis not present

## 2021-05-04 DIAGNOSIS — R29898 Other symptoms and signs involving the musculoskeletal system: Secondary | ICD-10-CM | POA: Diagnosis not present

## 2021-05-04 DIAGNOSIS — G8929 Other chronic pain: Secondary | ICD-10-CM

## 2021-05-04 NOTE — Therapy (Addendum)
[place] [address] [place], [place], [address] Phone: [phone]   Fax:  [phone]  Physical Therapy Treatment and Discharge Summary  PHYSICAL THERAPY DISCHARGE SUMMARY  Visits from Start of Care: 12  Current functional level related to goals / functional outcomes: See progress note for discharge status   Remaining deficits: Continued intermittent pain    Education / Equipment: HEP    Patient agrees to discharge. Patient goals were met. Patient is being discharged due to meeting the stated rehab goals.  Celyn P. Helene Kelp PT, MPH 06/18/21 12:49 PM   Patient Details  Name: Alex Cooke MRN: [id number] Date of Birth: [birth year] Referring Provider (PT): Tania Ade   Encounter Date: 05/04/2021   PT End of Session - 05/04/21 1200     Visit Number 12    Number of Visits 12    Date for PT Re-Evaluation 05/11/21    Authorization - Visit Number 12    Authorization - Number of Visits 12    PT Start Time 0800    PT Stop Time 1200    PT Time Calculation (min) 240 min    Activity Tolerance Patient tolerated treatment well    Behavior During Therapy [facility] for tasks assessed/performed             Past Medical History:  Diagnosis Date   Hypertension     Past Surgical History:  Procedure Laterality Date   INNER EAR SURGERY      There were no vitals filed for this visit.   Subjective Assessment - 05/04/21 0817     Subjective Pt reports increased soreness today after moving rings on arch (moving arm overhead from Lt to Rt) and when sliding weights on sheet (to simulate transferring pt on stretcher).  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    Currently in Pain? 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    Pain Relieving Factors ice, tape, copper sleeve                [facility] PT Assessment - 05/04/21 0001       Assessment   Medical Diagnosis s/p Lt rotator cuff repair    Referring Provider (PT) Tania Ade    Onset Date/Surgical Date 02/02/21    Hand Dominance Right    Next MD Visit 05/23/21    Prior Therapy work conditioning at another location      Strength   Left Shoulder Flexion --   5-/5   Left Shoulder ABduction 4+/5   with pain in superior shoulder   Left Shoulder Internal Rotation 4+/5    Left Shoulder External Rotation 5/5                [facility] Adult PT Treatment/Exercise - 05/04/21 0001       Exercises   Exercises --   see work conditioning flow sheet  (in media of chart)     Manual Therapy   Manual therapy comments I strip of reg Rock tape applied to Left posterior shoulders from infraspinatus insertion towards rhomboid insertion with 25% stretch.  I strip of reg Rock tape in X pattern placed over Lt [facility] joint for decompression and increase in proprioception.  PT Long Term Goals - 05/04/21 1206       PT LONG TERM GOAL #1   Title Pt will improve Lt shoulder strength to 5/5 to return to work with decreased pain and difficulty    Time 4    Period Weeks    Status Partially Met      PT LONG TERM GOAL #2   Title Pt will return to full duty work with pain <= 2/10    Time 4    Period Weeks    Status Partially Met                   Plan - 05/04/21 0842     Clinical Impression Statement Pt reporting point tenderness to touch at [facility] joint this morning.  Ktape applied to this area, as well as piece on posterior shoulder for increased proprioception for postural awareness.  Pt tolerated simulated sliding transfers with weighted box on sheet (lateral pull and ant/post pull); minor cues for improved body mechanics. He reported 3/10 pain in shoulder with exercises today. Pt has completed 3 wks of  work conditioning at this time, progressing from 2 hr sessions to 4 hr sessions.    Rehab Potential Good    PT Frequency 4x / week    PT Duration 4 weeks    PT Treatment/Interventions Cryotherapy;Moist Heat;Iontophoresis 64m/ml Dexamethasone;Electrical Stimulation;Functional mobility training;Therapeutic activities;Therapeutic exercise;Neuromuscular re-education;Taping;Patient/family education;Manual techniques;Dry needling;Vasopneumatic Device    Consulted and Agree with Plan of Care Patient             Patient will benefit from skilled therapeutic intervention in order to improve the following deficits and impairments:  Pain, Impaired UE functional use, Postural dysfunction, Decreased strength, Decreased activity tolerance, Decreased endurance  Visit Diagnosis: Chronic left shoulder pain  Muscle weakness (generalized)  Other symptoms and signs involving the musculoskeletal system  Abnormal posture  Stiffness of left shoulder, not elsewhere classified     Problem List There are no problems to display for this patient.  JKerin Perna PTA 05/04/21 12:08 PM   [place] [address]Phone: [phone]  Fax:  [phone] Name: GARNALDO HEFFRONMRN: [id number] of Birth: [birth year]

## 2021-05-14 ENCOUNTER — Other Ambulatory Visit (HOSPITAL_COMMUNITY): Payer: Self-pay

## 2021-05-14 DIAGNOSIS — M47819 Spondylosis without myelopathy or radiculopathy, site unspecified: Secondary | ICD-10-CM | POA: Diagnosis not present

## 2021-05-14 DIAGNOSIS — M4807 Spinal stenosis, lumbosacral region: Secondary | ICD-10-CM | POA: Diagnosis not present

## 2021-05-14 DIAGNOSIS — Z79899 Other long term (current) drug therapy: Secondary | ICD-10-CM | POA: Diagnosis not present

## 2021-05-14 DIAGNOSIS — M5410 Radiculopathy, site unspecified: Secondary | ICD-10-CM | POA: Diagnosis not present

## 2021-05-14 DIAGNOSIS — M25512 Pain in left shoulder: Secondary | ICD-10-CM | POA: Diagnosis not present

## 2021-05-16 DIAGNOSIS — Z79899 Other long term (current) drug therapy: Secondary | ICD-10-CM | POA: Diagnosis not present

## 2021-05-23 ENCOUNTER — Other Ambulatory Visit (HOSPITAL_COMMUNITY): Payer: Self-pay

## 2021-06-12 ENCOUNTER — Other Ambulatory Visit (HOSPITAL_COMMUNITY): Payer: Self-pay

## 2021-06-12 DIAGNOSIS — Z79899 Other long term (current) drug therapy: Secondary | ICD-10-CM | POA: Diagnosis not present

## 2021-06-12 DIAGNOSIS — M4807 Spinal stenosis, lumbosacral region: Secondary | ICD-10-CM | POA: Diagnosis not present

## 2021-06-12 DIAGNOSIS — M5410 Radiculopathy, site unspecified: Secondary | ICD-10-CM | POA: Diagnosis not present

## 2021-06-12 DIAGNOSIS — M25512 Pain in left shoulder: Secondary | ICD-10-CM | POA: Diagnosis not present

## 2021-06-12 DIAGNOSIS — M47819 Spondylosis without myelopathy or radiculopathy, site unspecified: Secondary | ICD-10-CM | POA: Diagnosis not present

## 2021-06-12 MED ORDER — SERTRALINE HCL 100 MG PO TABS
150.0000 mg | ORAL_TABLET | Freq: Every evening | ORAL | 0 refills | Status: DC
  Filled 2021-06-12: qty 135, 90d supply, fill #0

## 2021-06-12 MED ORDER — ROPINIROLE HCL 0.5 MG PO TABS
0.5000 mg | ORAL_TABLET | Freq: Every day | ORAL | 0 refills | Status: AC
  Filled 2021-06-12: qty 60, 30d supply, fill #0

## 2021-06-15 ENCOUNTER — Other Ambulatory Visit (HOSPITAL_COMMUNITY): Payer: Self-pay

## 2021-06-28 ENCOUNTER — Other Ambulatory Visit (HOSPITAL_COMMUNITY): Payer: Self-pay

## 2021-06-29 ENCOUNTER — Other Ambulatory Visit (HOSPITAL_COMMUNITY): Payer: Self-pay

## 2021-06-29 MED ORDER — SM LORATA-DINE D 10-240 MG PO TB24
1.0000 | ORAL_TABLET | Freq: Every day | ORAL | 0 refills | Status: DC
  Filled 2021-06-29: qty 90, 90d supply, fill #0

## 2021-07-02 ENCOUNTER — Other Ambulatory Visit (HOSPITAL_COMMUNITY): Payer: Self-pay

## 2021-07-03 ENCOUNTER — Other Ambulatory Visit (HOSPITAL_COMMUNITY): Payer: Self-pay

## 2021-07-04 ENCOUNTER — Other Ambulatory Visit (HOSPITAL_COMMUNITY): Payer: Self-pay

## 2021-07-06 ENCOUNTER — Other Ambulatory Visit: Payer: Self-pay

## 2021-07-09 ENCOUNTER — Other Ambulatory Visit (HOSPITAL_COMMUNITY): Payer: Self-pay

## 2021-07-12 ENCOUNTER — Other Ambulatory Visit (HOSPITAL_COMMUNITY): Payer: Self-pay

## 2021-07-12 DIAGNOSIS — M5410 Radiculopathy, site unspecified: Secondary | ICD-10-CM | POA: Diagnosis not present

## 2021-07-12 DIAGNOSIS — M4807 Spinal stenosis, lumbosacral region: Secondary | ICD-10-CM | POA: Diagnosis not present

## 2021-07-12 DIAGNOSIS — Z79899 Other long term (current) drug therapy: Secondary | ICD-10-CM | POA: Diagnosis not present

## 2021-07-12 DIAGNOSIS — M25512 Pain in left shoulder: Secondary | ICD-10-CM | POA: Diagnosis not present

## 2021-07-12 DIAGNOSIS — M47819 Spondylosis without myelopathy or radiculopathy, site unspecified: Secondary | ICD-10-CM | POA: Diagnosis not present

## 2021-07-12 MED ORDER — ROPINIROLE HCL 0.5 MG PO TABS
0.5000 mg | ORAL_TABLET | Freq: Every evening | ORAL | 3 refills | Status: AC
  Filled 2021-07-12: qty 60, 30d supply, fill #0
  Filled 2021-08-21: qty 60, 30d supply, fill #1

## 2021-07-16 ENCOUNTER — Other Ambulatory Visit (HOSPITAL_COMMUNITY): Payer: Self-pay

## 2021-07-17 ENCOUNTER — Other Ambulatory Visit (HOSPITAL_COMMUNITY): Payer: Self-pay

## 2021-07-17 MED ORDER — ONDANSETRON HCL 4 MG PO TABS
4.0000 mg | ORAL_TABLET | Freq: Every day | ORAL | 1 refills | Status: AC | PRN
  Filled 2021-07-17: qty 15, 15d supply, fill #0
  Filled 2021-10-01: qty 15, 15d supply, fill #1

## 2021-07-19 ENCOUNTER — Other Ambulatory Visit (HOSPITAL_COMMUNITY): Payer: Self-pay

## 2021-07-20 ENCOUNTER — Other Ambulatory Visit (HOSPITAL_COMMUNITY): Payer: Self-pay

## 2021-08-15 DIAGNOSIS — M5410 Radiculopathy, site unspecified: Secondary | ICD-10-CM | POA: Diagnosis not present

## 2021-08-15 DIAGNOSIS — M47819 Spondylosis without myelopathy or radiculopathy, site unspecified: Secondary | ICD-10-CM | POA: Diagnosis not present

## 2021-08-15 DIAGNOSIS — M25512 Pain in left shoulder: Secondary | ICD-10-CM | POA: Diagnosis not present

## 2021-08-15 DIAGNOSIS — Z79899 Other long term (current) drug therapy: Secondary | ICD-10-CM | POA: Diagnosis not present

## 2021-08-15 DIAGNOSIS — M4807 Spinal stenosis, lumbosacral region: Secondary | ICD-10-CM | POA: Diagnosis not present

## 2021-08-17 DIAGNOSIS — Z79899 Other long term (current) drug therapy: Secondary | ICD-10-CM | POA: Diagnosis not present

## 2021-08-21 ENCOUNTER — Other Ambulatory Visit (HOSPITAL_COMMUNITY): Payer: Self-pay

## 2021-08-22 ENCOUNTER — Other Ambulatory Visit (HOSPITAL_COMMUNITY): Payer: Self-pay

## 2021-08-22 MED ORDER — SERTRALINE HCL 100 MG PO TABS
150.0000 mg | ORAL_TABLET | Freq: Every day | ORAL | 0 refills | Status: DC
Start: 2021-08-22 — End: 2021-11-07
  Filled 2021-08-22: qty 135, 90d supply, fill #0

## 2021-08-23 ENCOUNTER — Other Ambulatory Visit (HOSPITAL_COMMUNITY): Payer: Self-pay

## 2021-08-28 ENCOUNTER — Other Ambulatory Visit (HOSPITAL_COMMUNITY): Payer: Self-pay

## 2021-09-03 ENCOUNTER — Other Ambulatory Visit (HOSPITAL_COMMUNITY): Payer: Self-pay | Admitting: Ophthalmology

## 2021-09-03 ENCOUNTER — Other Ambulatory Visit: Payer: Self-pay | Admitting: Ophthalmology

## 2021-09-03 DIAGNOSIS — H4710 Unspecified papilledema: Secondary | ICD-10-CM

## 2021-09-04 DIAGNOSIS — I10 Essential (primary) hypertension: Secondary | ICD-10-CM | POA: Diagnosis not present

## 2021-09-10 ENCOUNTER — Other Ambulatory Visit (HOSPITAL_COMMUNITY): Payer: Self-pay | Admitting: Ophthalmology

## 2021-09-10 ENCOUNTER — Encounter (HOSPITAL_COMMUNITY): Payer: Self-pay

## 2021-09-10 ENCOUNTER — Ambulatory Visit (HOSPITAL_COMMUNITY)
Admission: RE | Admit: 2021-09-10 | Discharge: 2021-09-10 | Disposition: A | Discharge status: Home / Self Care | Payer: 59 | Source: Ambulatory Visit | Attending: Ophthalmology | Admitting: Ophthalmology

## 2021-09-10 ENCOUNTER — Other Ambulatory Visit: Payer: Self-pay | Admitting: Ophthalmology

## 2021-09-10 ENCOUNTER — Other Ambulatory Visit: Payer: Self-pay

## 2021-09-10 DIAGNOSIS — H4710 Unspecified papilledema: Secondary | ICD-10-CM

## 2021-09-10 DIAGNOSIS — J341 Cyst and mucocele of nose and nasal sinus: Secondary | ICD-10-CM | POA: Diagnosis not present

## 2021-09-10 DIAGNOSIS — J329 Chronic sinusitis, unspecified: Secondary | ICD-10-CM | POA: Diagnosis not present

## 2021-09-10 MED ORDER — GADOBUTROL 1 MMOL/ML IV SOLN
7.5000 mL | Freq: Once | INTRAVENOUS | Status: AC | PRN
  Administered 2021-09-10: 7.5 mL via INTRAVENOUS

## 2021-09-11 ENCOUNTER — Encounter (HOSPITAL_COMMUNITY): Payer: Self-pay

## 2021-09-11 ENCOUNTER — Ambulatory Visit (HOSPITAL_COMMUNITY): Payer: Self-pay

## 2021-09-12 DIAGNOSIS — M25512 Pain in left shoulder: Secondary | ICD-10-CM | POA: Diagnosis not present

## 2021-09-12 DIAGNOSIS — M4807 Spinal stenosis, lumbosacral region: Secondary | ICD-10-CM | POA: Diagnosis not present

## 2021-09-12 DIAGNOSIS — M47819 Spondylosis without myelopathy or radiculopathy, site unspecified: Secondary | ICD-10-CM | POA: Diagnosis not present

## 2021-09-12 DIAGNOSIS — M5410 Radiculopathy, site unspecified: Secondary | ICD-10-CM | POA: Diagnosis not present

## 2021-09-12 DIAGNOSIS — Z79899 Other long term (current) drug therapy: Secondary | ICD-10-CM | POA: Diagnosis not present

## 2021-09-18 DIAGNOSIS — H4712 Papilledema associated with decreased ocular pressure: Secondary | ICD-10-CM | POA: Diagnosis not present

## 2021-09-24 DIAGNOSIS — H4710 Unspecified papilledema: Secondary | ICD-10-CM | POA: Diagnosis not present

## 2021-09-24 DIAGNOSIS — H4711 Papilledema associated with increased intracranial pressure: Secondary | ICD-10-CM | POA: Diagnosis not present

## 2021-09-24 DIAGNOSIS — B009 Herpesviral infection, unspecified: Secondary | ICD-10-CM | POA: Diagnosis not present

## 2021-09-27 DIAGNOSIS — H469 Unspecified optic neuritis: Secondary | ICD-10-CM | POA: Diagnosis not present

## 2021-09-27 DIAGNOSIS — H32 Chorioretinal disorders in diseases classified elsewhere: Secondary | ICD-10-CM | POA: Diagnosis not present

## 2021-09-27 DIAGNOSIS — B0059 Other herpesviral disease of eye: Secondary | ICD-10-CM | POA: Diagnosis not present

## 2021-09-27 DIAGNOSIS — R9082 White matter disease, unspecified: Secondary | ICD-10-CM | POA: Diagnosis not present

## 2021-09-27 DIAGNOSIS — H4710 Unspecified papilledema: Secondary | ICD-10-CM | POA: Diagnosis not present

## 2021-10-01 ENCOUNTER — Other Ambulatory Visit (HOSPITAL_COMMUNITY): Payer: Self-pay

## 2021-10-02 ENCOUNTER — Other Ambulatory Visit (HOSPITAL_COMMUNITY): Payer: Self-pay

## 2021-10-02 MED ORDER — METOPROLOL SUCCINATE ER 25 MG PO TB24
25.0000 mg | ORAL_TABLET | Freq: Every day | ORAL | 4 refills | Status: AC
  Filled 2021-10-02: qty 30, 30d supply, fill #0
  Filled 2021-10-11 – 2021-11-06 (×2): qty 30, 30d supply, fill #1

## 2021-10-03 ENCOUNTER — Other Ambulatory Visit (HOSPITAL_COMMUNITY): Payer: Self-pay

## 2021-10-04 ENCOUNTER — Other Ambulatory Visit (HOSPITAL_COMMUNITY): Payer: Self-pay

## 2021-10-04 MED ORDER — SM LORATA-DINE D 10-240 MG PO TB24
1.0000 | ORAL_TABLET | Freq: Every day | ORAL | 0 refills | Status: DC
  Filled 2021-10-04 (×2): qty 90, 90d supply, fill #0

## 2021-10-05 ENCOUNTER — Other Ambulatory Visit (HOSPITAL_COMMUNITY): Payer: Self-pay

## 2021-10-08 ENCOUNTER — Other Ambulatory Visit (HOSPITAL_COMMUNITY): Payer: Self-pay

## 2021-10-08 MED ORDER — ZOLPIDEM TARTRATE ER 12.5 MG PO TBCR
12.5000 mg | EXTENDED_RELEASE_TABLET | Freq: Every evening | ORAL | 0 refills | Status: DC | PRN
Start: 2021-10-06 — End: 2022-01-03
  Filled 2021-10-08 – 2021-10-09 (×2): qty 11, 11d supply, fill #0
  Filled 2021-10-09: qty 79, 79d supply, fill #0

## 2021-10-09 ENCOUNTER — Other Ambulatory Visit (HOSPITAL_COMMUNITY): Payer: Self-pay

## 2021-10-10 ENCOUNTER — Other Ambulatory Visit (HOSPITAL_COMMUNITY): Payer: Self-pay

## 2021-10-10 DIAGNOSIS — M25512 Pain in left shoulder: Secondary | ICD-10-CM | POA: Diagnosis not present

## 2021-10-10 DIAGNOSIS — M47819 Spondylosis without myelopathy or radiculopathy, site unspecified: Secondary | ICD-10-CM | POA: Diagnosis not present

## 2021-10-10 DIAGNOSIS — M5410 Radiculopathy, site unspecified: Secondary | ICD-10-CM | POA: Diagnosis not present

## 2021-10-10 DIAGNOSIS — Z79899 Other long term (current) drug therapy: Secondary | ICD-10-CM | POA: Diagnosis not present

## 2021-10-10 DIAGNOSIS — M4807 Spinal stenosis, lumbosacral region: Secondary | ICD-10-CM | POA: Diagnosis not present

## 2021-10-10 MED ORDER — ROPINIROLE HCL 0.5 MG PO TABS
0.5000 mg | ORAL_TABLET | Freq: Every evening | ORAL | 3 refills | Status: AC
  Filled 2021-10-10: qty 60, 30d supply, fill #0
  Filled 2021-11-06: qty 60, 30d supply, fill #1
  Filled 2021-12-12: qty 60, 30d supply, fill #2
  Filled 2022-01-18: qty 60, 30d supply, fill #3

## 2021-10-11 ENCOUNTER — Other Ambulatory Visit (HOSPITAL_COMMUNITY): Payer: Self-pay

## 2021-10-12 DIAGNOSIS — Z79899 Other long term (current) drug therapy: Secondary | ICD-10-CM | POA: Diagnosis not present

## 2021-10-22 DIAGNOSIS — B009 Herpesviral infection, unspecified: Secondary | ICD-10-CM | POA: Diagnosis not present

## 2021-10-22 DIAGNOSIS — H4711 Papilledema associated with increased intracranial pressure: Secondary | ICD-10-CM | POA: Diagnosis not present

## 2021-11-06 ENCOUNTER — Other Ambulatory Visit (HOSPITAL_COMMUNITY): Payer: Self-pay

## 2021-11-07 ENCOUNTER — Other Ambulatory Visit (HOSPITAL_COMMUNITY): Payer: Self-pay

## 2021-11-07 MED ORDER — SERTRALINE HCL 100 MG PO TABS
150.0000 mg | ORAL_TABLET | Freq: Every day | ORAL | 0 refills | Status: DC
  Filled 2021-11-07: qty 135, 90d supply, fill #0

## 2021-11-08 DIAGNOSIS — M4807 Spinal stenosis, lumbosacral region: Secondary | ICD-10-CM | POA: Diagnosis not present

## 2021-11-08 DIAGNOSIS — Z79899 Other long term (current) drug therapy: Secondary | ICD-10-CM | POA: Diagnosis not present

## 2021-11-08 DIAGNOSIS — M25512 Pain in left shoulder: Secondary | ICD-10-CM | POA: Diagnosis not present

## 2021-11-08 DIAGNOSIS — M5410 Radiculopathy, site unspecified: Secondary | ICD-10-CM | POA: Diagnosis not present

## 2021-11-08 DIAGNOSIS — M47819 Spondylosis without myelopathy or radiculopathy, site unspecified: Secondary | ICD-10-CM | POA: Diagnosis not present

## 2021-12-04 DIAGNOSIS — M5410 Radiculopathy, site unspecified: Secondary | ICD-10-CM | POA: Diagnosis not present

## 2021-12-04 DIAGNOSIS — Z79899 Other long term (current) drug therapy: Secondary | ICD-10-CM | POA: Diagnosis not present

## 2021-12-04 DIAGNOSIS — M4807 Spinal stenosis, lumbosacral region: Secondary | ICD-10-CM | POA: Diagnosis not present

## 2021-12-04 DIAGNOSIS — M25512 Pain in left shoulder: Secondary | ICD-10-CM | POA: Diagnosis not present

## 2021-12-04 DIAGNOSIS — M47819 Spondylosis without myelopathy or radiculopathy, site unspecified: Secondary | ICD-10-CM | POA: Diagnosis not present

## 2021-12-06 ENCOUNTER — Other Ambulatory Visit (HOSPITAL_COMMUNITY): Payer: Self-pay

## 2021-12-10 DIAGNOSIS — E782 Mixed hyperlipidemia: Secondary | ICD-10-CM | POA: Diagnosis not present

## 2021-12-10 DIAGNOSIS — Z6827 Body mass index (BMI) 27.0-27.9, adult: Secondary | ICD-10-CM | POA: Diagnosis not present

## 2021-12-10 DIAGNOSIS — H539 Unspecified visual disturbance: Secondary | ICD-10-CM | POA: Diagnosis not present

## 2021-12-10 DIAGNOSIS — I10 Essential (primary) hypertension: Secondary | ICD-10-CM | POA: Diagnosis not present

## 2021-12-10 DIAGNOSIS — Z713 Dietary counseling and surveillance: Secondary | ICD-10-CM | POA: Diagnosis not present

## 2021-12-12 ENCOUNTER — Other Ambulatory Visit (HOSPITAL_COMMUNITY): Payer: Self-pay

## 2021-12-12 MED ORDER — ZOLPIDEM TARTRATE ER 12.5 MG PO TBCR
12.5000 mg | EXTENDED_RELEASE_TABLET | Freq: Every evening | ORAL | 0 refills | Status: AC | PRN
  Filled 2021-12-12: qty 90, 90d supply, fill #0

## 2021-12-13 ENCOUNTER — Other Ambulatory Visit (HOSPITAL_COMMUNITY): Payer: Self-pay

## 2021-12-14 ENCOUNTER — Other Ambulatory Visit (HOSPITAL_COMMUNITY): Payer: Self-pay

## 2021-12-20 ENCOUNTER — Other Ambulatory Visit (HOSPITAL_COMMUNITY): Payer: Self-pay

## 2021-12-21 ENCOUNTER — Other Ambulatory Visit (HOSPITAL_COMMUNITY): Payer: Self-pay

## 2021-12-24 ENCOUNTER — Other Ambulatory Visit (HOSPITAL_COMMUNITY): Payer: Self-pay

## 2021-12-25 ENCOUNTER — Other Ambulatory Visit (HOSPITAL_COMMUNITY): Payer: Self-pay

## 2021-12-25 MED ORDER — METOPROLOL SUCCINATE ER 25 MG PO TB24
25.0000 mg | ORAL_TABLET | Freq: Every day | ORAL | 5 refills | Status: AC
  Filled 2021-12-25: qty 30, 30d supply, fill #0
  Filled 2022-01-18: qty 30, 30d supply, fill #1

## 2021-12-28 ENCOUNTER — Other Ambulatory Visit (HOSPITAL_COMMUNITY): Payer: Self-pay

## 2021-12-28 MED ORDER — SM LORATA-DINE D 10-240 MG PO TB24
1.0000 | ORAL_TABLET | Freq: Every day | ORAL | 0 refills | Status: AC
Start: 2021-12-28 — End: ?
  Filled 2021-12-28: qty 90, 90d supply, fill #0

## 2022-01-01 ENCOUNTER — Other Ambulatory Visit (HOSPITAL_COMMUNITY): Payer: Self-pay

## 2022-01-03 ENCOUNTER — Other Ambulatory Visit (HOSPITAL_COMMUNITY): Payer: Self-pay

## 2022-01-03 MED ORDER — ZOLPIDEM TARTRATE ER 12.5 MG PO TBCR
12.5000 mg | EXTENDED_RELEASE_TABLET | Freq: Every evening | ORAL | 0 refills | Status: AC | PRN
  Filled 2022-01-03 – 2022-01-07 (×2): qty 90, 90d supply, fill #0

## 2022-01-04 DIAGNOSIS — M25512 Pain in left shoulder: Secondary | ICD-10-CM | POA: Diagnosis not present

## 2022-01-04 DIAGNOSIS — M5410 Radiculopathy, site unspecified: Secondary | ICD-10-CM | POA: Diagnosis not present

## 2022-01-04 DIAGNOSIS — M47819 Spondylosis without myelopathy or radiculopathy, site unspecified: Secondary | ICD-10-CM | POA: Diagnosis not present

## 2022-01-04 DIAGNOSIS — M4807 Spinal stenosis, lumbosacral region: Secondary | ICD-10-CM | POA: Diagnosis not present

## 2022-01-04 DIAGNOSIS — Z79899 Other long term (current) drug therapy: Secondary | ICD-10-CM | POA: Diagnosis not present

## 2022-01-07 ENCOUNTER — Other Ambulatory Visit (HOSPITAL_COMMUNITY): Payer: Self-pay

## 2022-01-08 ENCOUNTER — Other Ambulatory Visit (HOSPITAL_COMMUNITY): Payer: Self-pay

## 2022-01-15 ENCOUNTER — Other Ambulatory Visit (HOSPITAL_COMMUNITY): Payer: Self-pay

## 2022-01-15 MED ORDER — PHENTERMINE HCL 30 MG PO CAPS
30.0000 mg | ORAL_CAPSULE | Freq: Every morning | ORAL | 0 refills | Status: DC
  Filled 2022-01-15: qty 30, 30d supply, fill #0

## 2022-01-18 ENCOUNTER — Other Ambulatory Visit (HOSPITAL_COMMUNITY): Payer: Self-pay

## 2022-01-18 MED ORDER — SERTRALINE HCL 100 MG PO TABS
150.0000 mg | ORAL_TABLET | Freq: Every day | ORAL | 0 refills | Status: AC
  Filled 2022-01-18: qty 135, 90d supply, fill #0

## 2022-01-21 ENCOUNTER — Other Ambulatory Visit (HOSPITAL_COMMUNITY): Payer: Self-pay

## 2022-01-29 ENCOUNTER — Other Ambulatory Visit (HOSPITAL_COMMUNITY): Payer: Self-pay

## 2022-01-30 ENCOUNTER — Other Ambulatory Visit (HOSPITAL_COMMUNITY): Payer: Self-pay

## 2022-02-01 DIAGNOSIS — Z79899 Other long term (current) drug therapy: Secondary | ICD-10-CM | POA: Diagnosis not present

## 2022-02-01 DIAGNOSIS — M47819 Spondylosis without myelopathy or radiculopathy, site unspecified: Secondary | ICD-10-CM | POA: Diagnosis not present

## 2022-02-01 DIAGNOSIS — M5410 Radiculopathy, site unspecified: Secondary | ICD-10-CM | POA: Diagnosis not present

## 2022-02-01 DIAGNOSIS — M25512 Pain in left shoulder: Secondary | ICD-10-CM | POA: Diagnosis not present

## 2022-02-01 DIAGNOSIS — M4807 Spinal stenosis, lumbosacral region: Secondary | ICD-10-CM | POA: Diagnosis not present

## 2022-02-18 ENCOUNTER — Other Ambulatory Visit (HOSPITAL_COMMUNITY): Payer: Self-pay

## 2022-02-18 MED ORDER — WEGOVY 2.4 MG/0.75ML ~~LOC~~ SOAJ
2.4000 mg | SUBCUTANEOUS | 3 refills | Status: AC
  Filled 2022-02-18: qty 4, 28d supply, fill #0

## 2022-02-20 ENCOUNTER — Other Ambulatory Visit (HOSPITAL_COMMUNITY): Payer: Self-pay

## 2022-10-01 IMAGING — DX DG SHOULDER 2+V*L*
4 series · 4 of 4 positions shown · non-contrast
Comparison: None.

CLINICAL DATA: Acute LEFT shoulder pain following fall. Initial
encounter.

EXAM:
LEFT SHOULDER - 2+ VIEW

[shoulder ap]
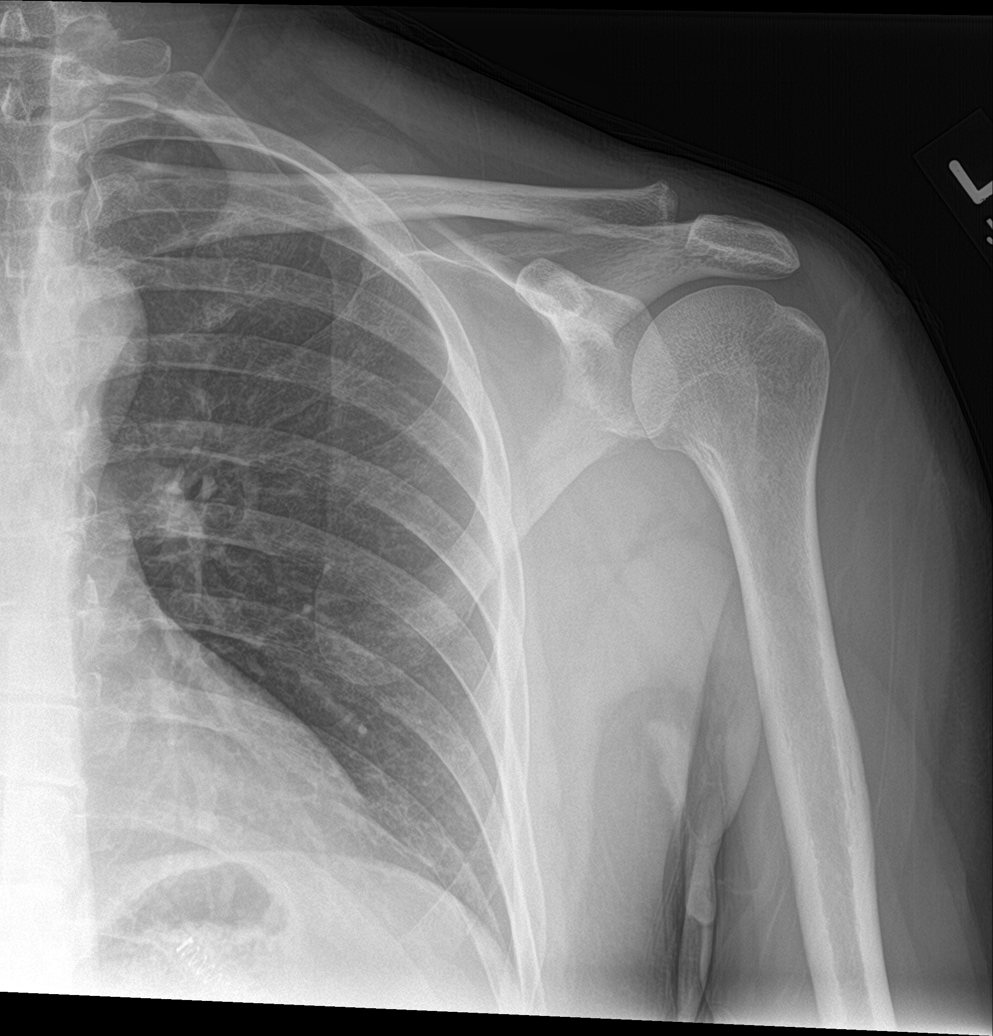

[shoulder grashey]
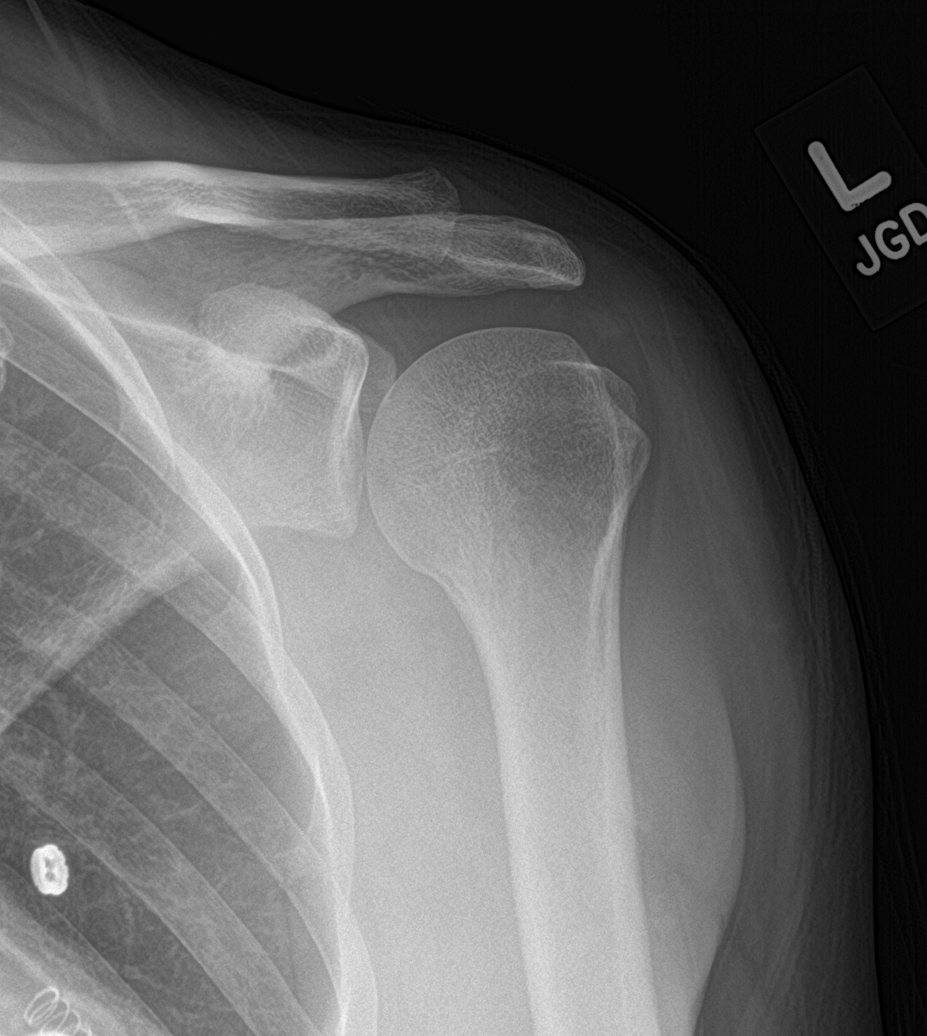

[shoulder y-view]
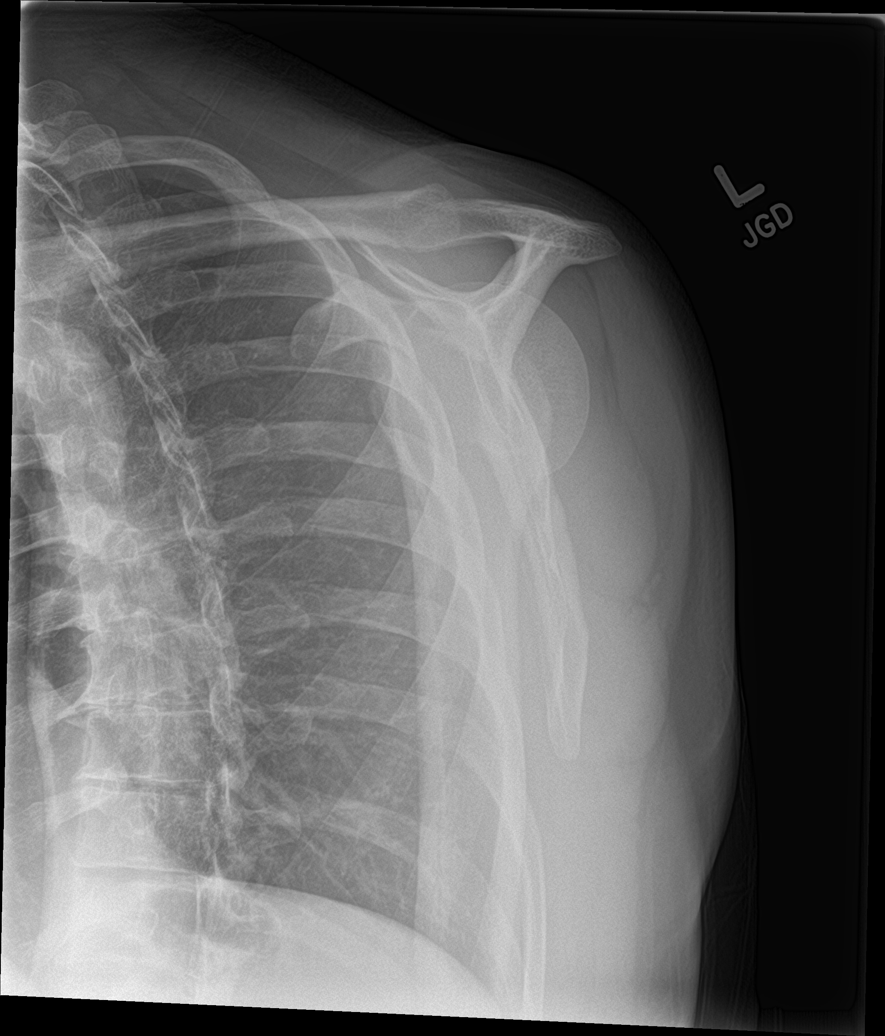

[shoulder axial]
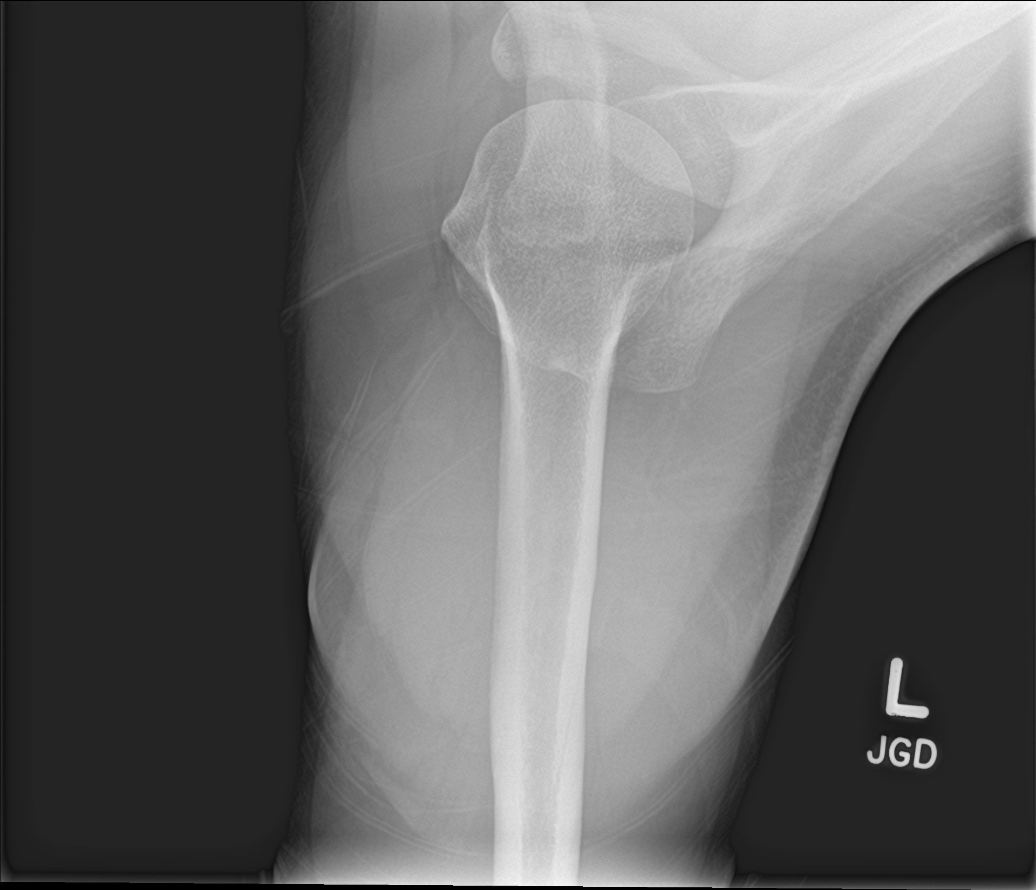

[4 of 4 positions shown; findings below may reference images not displayed]

FINDINGS: There is no evidence of fracture or dislocation. There is no
evidence of arthropathy or other focal bone abnormality. Soft
tissues are unremarkable.
IMPRESSION: Negative.
